# Patient Record
Sex: Female | Born: 1985 | Race: White | Hispanic: No | Marital: Married | Smoking: Never smoker
Health system: Southern US, Community
[De-identification: ages and names within clinical notes are randomized; demographics above are authoritative.]

## PROBLEM LIST (undated history)

## (undated) DIAGNOSIS — Z789 Other specified health status: Secondary | ICD-10-CM

## (undated) HISTORY — DX: Other specified health status: Z78.9

## (undated) HISTORY — PX: TONSILLECTOMY: SUR1361

---
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
## 2017-08-05 ENCOUNTER — Other Ambulatory Visit (INDEPENDENT_AMBULATORY_CARE_PROVIDER_SITE_OTHER): Payer: BLUE CROSS/BLUE SHIELD

## 2017-08-05 VITALS — BP 117/76 | HR 66 | Resp 16 | Ht 68.0 in | Wt 131.6 lb

## 2017-08-05 DIAGNOSIS — Z3201 Encounter for pregnancy test, result positive: Secondary | ICD-10-CM

## 2017-08-05 DIAGNOSIS — Z3200 Encounter for pregnancy test, result unknown: Secondary | ICD-10-CM

## 2017-08-05 LAB — POCT URINE PREGNANCY: PREG TEST UR: POSITIVE — AB

## 2017-08-05 NOTE — Progress Notes (Addendum)
Ms. Tonna Corneriechowski presents today for UPT. She has no unusual complaints. LMP: 06/23/2017  EDD: 03/30/2018    OBJECTIVE: Appears well, in no apparent distress.  OB History   None    Home UPT Result: 07/25/17 Positive In-Office UPT result: Positive I have reviewed the patient's medical, obstetrical, social, and family histories, and medications.   ASSESSMENT: Positive pregnancy test  PLAN [facility] [place] Patient to schedule NEW OB visit/appointment for September Continue taking Prenatal vitamins

## 2017-08-07 NOTE — Progress Notes (Signed)
I have reviewed the chart and agree with nursing staff's documentation of this patient's encounter.  Janille Draughon, MD 08/07/2017 1:23 PM    

## 2017-08-26 ENCOUNTER — Other Ambulatory Visit: Payer: Self-pay

## 2017-08-26 NOTE — Telephone Encounter (Signed)
I have left a message for patient regarding prenatal gummies. She can purchase any PNV otc.

## 2017-08-26 NOTE — Telephone Encounter (Signed)
-----   Message from Lindell SparHeather L Bacon, [place] sent at 08/26/2017  4:04 PM EDT ----- Regarding: rx for gummy prenatal vit Please send rx for gummy vits for prenatal to [facility] on [address]

## 2017-09-04 ENCOUNTER — Encounter: Payer: Self-pay | Admitting: Student

## 2017-09-04 ENCOUNTER — Ambulatory Visit (INDEPENDENT_AMBULATORY_CARE_PROVIDER_SITE_OTHER): Payer: BLUE CROSS/BLUE SHIELD | Admitting: Student

## 2017-09-04 VITALS — BP 129/79 | HR 79 | Wt 127.0 lb

## 2017-09-04 DIAGNOSIS — Z3401 Encounter for supervision of normal first pregnancy, first trimester: Secondary | ICD-10-CM

## 2017-09-04 DIAGNOSIS — Z3400 Encounter for supervision of normal first pregnancy, unspecified trimester: Secondary | ICD-10-CM | POA: Insufficient documentation

## 2017-09-04 NOTE — Progress Notes (Signed)
Pap 2017

## 2017-09-04 NOTE — Progress Notes (Signed)
  Subjective:    Jody Cunningham is being seen today for her first obstetrical visit.  This is a planned pregnancy. She is at [redacted]w[redacted]d gestation. Her obstetrical history is significant for nothing. . Relationship with FOB: spouse, living together. Patient does intend to breast feed. Pregnancy history fully reviewed.  Patient reports no complaints and occasional vomiting (4 times total). . Reports normal pap in the past.  Review of Systems:   Review of Systems  Constitutional: Negative.   HENT: Negative.   Respiratory: Negative.   Cardiovascular: Negative.   Gastrointestinal: Negative.   Genitourinary: Negative.   Musculoskeletal: Negative.   Neurological: Negative.   Hematological: Negative.   Psychiatric/Behavioral: Negative.     Objective:     BP 129/79   Pulse 79   Wt 127 lb (57.6 kg)   LMP 06/23/2017 (Within Days)   BMI 19.31 kg/m  Physical Exam  Constitutional: She appears well-developed.  HENT:  Head: Normocephalic.  Neck: Normal range of motion.  GI: Soft.  Genitourinary: Vagina normal.  Musculoskeletal: Normal range of motion.  Neurological: She is alert.  Skin: Skin is warm and dry.    Exam Breast exam normal, benign, no lumps or masses.  Pelvic exam normal; S=D.    Assessment:    Pregnancy: G1P0 Patient Active Problem List   Diagnosis Date Noted  . Supervision of normal first pregnancy, antepartum 09/04/2017       Plan:     Initial labs drawn. Prenatal vitamins. Problem list reviewed and updated. AFP3 discussed: will call insurance and then let us know. . Role of ultrasound in pregnancy discussed; fetal survey: ordered. Amniocentesis discussed: not indicated. Follow up in 3 weeks. 90% of 30 min visit spent on counseling and coordination of care.  -Pap records will be requested -Welcomed patient to practice; oriented her to role of students, APPs.  Charlesetta Garibaldi [facility] 09/04/2017

## 2017-09-04 NOTE — Patient Instructions (Signed)
-Call insurance and ask abotu Cystic Fibrosis carrier test, Spinal Muscular Atrophy test, and Non-invasive prenatal test.     Eating Plan for Pregnant Women While you are pregnant, your body will require additional nutrition to help support your growing baby. It is recommended that you consume:  150 additional calories each day during your first trimester.  300 additional calories each day during your second trimester.  300 additional calories each day during your third trimester.  Eating a healthy, well-balanced diet is very important for your health and for your baby's health. You also have a higher need for some vitamins and minerals, such as folic acid, calcium, iron, and vitamin D. What do I need to know about eating during pregnancy?  Do not try to lose weight or go on a diet during pregnancy.  Choose healthy, nutritious foods. Choose  of a sandwich with a glass of milk instead of a candy bar or a high-calorie sugar-sweetened beverage.  Limit your overall intake of foods that have "empty calories." These are foods that have little nutritional value, such as sweets, desserts, candies, sugar-sweetened beverages, and fried foods.  Eat a variety of foods, especially fruits and vegetables.  Take a prenatal vitamin to help meet the additional needs during pregnancy, specifically for folic acid, iron, calcium, and vitamin D.  Remember to stay active. Ask your health care provider for exercise recommendations that are specific to you.  Practice good food safety and cleanliness, such as washing your hands before you eat and after you prepare raw meat. This helps to prevent foodborne illnesses, such as listeriosis, that can be very dangerous for your baby. Ask your health care provider for more information about listeriosis. What does 150 extra calories look like? Healthy options for an additional 150 calories each day could be any of the following:  Plain low-fat yogurt (6-8 oz) with   cup of berries.  1 apple with 2 teaspoons of peanut butter.  Cut-up vegetables with  cup of hummus.  Low-fat chocolate milk (8 oz or 1 cup).  1 string cheese with 1 medium orange.   of a peanut butter and jelly sandwich on whole-wheat bread (1 tsp of peanut butter).  For 300 calories, you could eat two of those healthy options each day. What is a healthy amount of weight to gain? The recommended amount of weight for you to gain is based on your pre-pregnancy BMI. If your pre-pregnancy BMI was:  Less than 18 (underweight), you should gain 28-40 lb.  18-24.9 (normal), you should gain 25-35 lb.  25-29.9 (overweight), you should gain 15-25 lb.  Greater than 30 (obese), you should gain 11-20 lb.  What if I am having twins or multiples? Generally, pregnant women who will be having twins or multiples may need to increase their daily calories by 300-600 calories each day. The recommended range for total weight gain is 25-54 lb, depending on your pre-pregnancy BMI. Talk with your health care provider for specific guidance about additional nutritional needs, weight gain, and exercise during your pregnancy. What foods can I eat? Grains Any grains. Try to choose whole grains, such as whole-wheat bread, oatmeal, or brown rice. Vegetables Any vegetables. Try to eat a variety of colors and types of vegetables to get a full range of vitamins and minerals. Remember to wash your vegetables well before eating. Fruits Any fruits. Try to eat a variety of colors and types of fruit to get a full range of vitamins and minerals. Remember to wash your  fruits well before eating. Meats and Other Protein Sources Lean meats, including chicken, [place], fish, and lean cuts of beef, veal, or pork. Make sure that all meats are cooked to "well done." Tofu. Tempeh. Beans. Eggs. Peanut butter and other nut butters. Seafood, such as shrimp, crab, and lobster. If you choose fish, select types that are higher in omega-3  fatty acids, including salmon, herring, mussels, trout, sardines, and pollock. Make sure that all meats are cooked to food-safe temperatures. Dairy Pasteurized milk and milk alternatives. Pasteurized yogurt and pasteurized cheese. Cottage cheese. Sour cream. Beverages Water. Juices that contain 100% fruit juice or vegetable juice. Caffeine-free teas and decaffeinated coffee. Drinks that contain caffeine are okay to drink, but it is better to avoid caffeine. Keep your total caffeine intake to less than 200 mg each day (12 oz of coffee, tea, or soda) or as directed by your health care provider. Condiments Any pasteurized condiments. Sweets and Desserts Any sweets and desserts. Fats and Oils Any fats and oils. The items listed above may not be a complete list of recommended foods or beverages. Contact your dietitian for more options. What foods are not recommended? Vegetables Unpasteurized (raw) vegetable juices. Fruits Unpasteurized (raw) fruit juices. Meats and Other Protein Sources Cured meats that have nitrates, such as bacon, salami, and hotdogs. Luncheon meats, bologna, or other deli meats (unless they are reheated until they are steaming hot). Refrigerated pate, meat spreads from a meat counter, smoked seafood that is found in the refrigerated section of a store. Raw fish, such as sushi or sashimi. High mercury content fish, such as tilefish, shark, swordfish, and king mackerel. Raw meats, such as tuna or beef tartare. Undercooked meats and poultry. Make sure that all meats are cooked to food-safe temperatures. Dairy Unpasteurized (raw) milk and any foods that have raw milk in them. Soft cheeses, such as feta, queso blanco, queso fresco, Brie, Camembert cheeses, blue-veined cheeses, and Panela cheese (unless it is made with pasteurized milk, which must be stated on the label). Beverages Alcohol. Sugar-sweetened beverages, such as sodas, teas, or energy drinks. Condiments Homemade  fermented foods and drinks, such as pickles, sauerkraut, or kombucha drinks. (Store-bought pasteurized versions of these are okay.) Other Salads that are made in the store, such as ham salad, chicken salad, egg salad, tuna salad, and seafood salad. The items listed above may not be a complete list of foods and beverages to avoid. Contact your dietitian for more information. This information is not intended to replace advice given to you by your health care provider. Make sure you discuss any questions you have with your health care provider. Document Released: 10/02/2013 Document Revised: 05/26/2015 Document Reviewed: 06/02/2013 Elsevier Interactive Patient Education  [employer].

## 2017-09-05 ENCOUNTER — Encounter: Payer: Self-pay | Admitting: Student

## 2017-09-05 DIAGNOSIS — Z6791 Unspecified blood type, Rh negative: Secondary | ICD-10-CM | POA: Insufficient documentation

## 2017-09-05 DIAGNOSIS — O26899 Other specified pregnancy related conditions, unspecified trimester: Secondary | ICD-10-CM | POA: Insufficient documentation

## 2017-09-05 LAB — OBSTETRIC PANEL, INCLUDING HIV
Antibody Screen: NEGATIVE
BASOS: 0 %
Basophils Absolute: 0 10*3/uL (ref 0.0–0.2)
EOS (ABSOLUTE): 0 10*3/uL (ref 0.0–0.4)
EOS: 0 %
HEMATOCRIT: 39.3 % (ref 34.0–46.6)
HEMOGLOBIN: 13.3 g/dL (ref 11.1–15.9)
HEP B S AG: NEGATIVE
HIV SCREEN 4TH GENERATION: NONREACTIVE
Immature Grans (Abs): 0 10*3/uL (ref 0.0–0.1)
Immature Granulocytes: 0 %
LYMPHS ABS: 2.3 10*3/uL (ref 0.7–3.1)
Lymphs: 20 %
MCH: 31.7 pg (ref 26.6–33.0)
MCHC: 33.8 g/dL (ref 31.5–35.7)
MCV: 94 fL (ref 79–97)
Monocytes Absolute: 0.6 10*3/uL (ref 0.1–0.9)
Monocytes: 5 %
NEUTROS ABS: 8.6 10*3/uL — AB (ref 1.4–7.0)
Neutrophils: 75 %
Platelets: 305 10*3/uL (ref 150–450)
RBC: 4.2 x10E6/uL (ref 3.77–5.28)
RDW: 12.4 % (ref 12.3–15.4)
RH TYPE: NEGATIVE
RPR: NONREACTIVE
RUBELLA: 3.42 {index} (ref >0.99)
WBC: 11.6 10*3/uL — AB (ref 3.4–10.8)

## 2017-09-06 LAB — URINE CULTURE, OB REFLEX

## 2017-09-06 LAB — CULTURE, OB URINE

## 2017-09-12 LAB — SMN1 COPY NUMBER ANALYSIS (SMA CARRIER SCREENING)

## 2017-09-12 LAB — HEMOGLOBINOPATHY EVALUATION
HGB A: 97.1 % (ref 96.4–98.8)
HGB C: 0 %
HGB S: 0 %
HGB VARIANT: 0 %
Hemoglobin A2 Quantitation: 2.3 % (ref 1.8–3.2)
Hemoglobin F Quantitation: 0.6 % (ref 0.0–2.0)

## 2017-09-12 LAB — CYSTIC FIBROSIS MUTATION 97: GENE DIS ANAL CARRIER INTERP BLD/T-IMP: NOT DETECTED

## 2017-10-01 ENCOUNTER — Ambulatory Visit (INDEPENDENT_AMBULATORY_CARE_PROVIDER_SITE_OTHER): Payer: BLUE CROSS/BLUE SHIELD | Admitting: Obstetrics & Gynecology

## 2017-10-01 ENCOUNTER — Encounter: Payer: Self-pay | Admitting: Obstetrics & Gynecology

## 2017-10-01 VITALS — BP 124/85 | HR 65 | Wt 128.0 lb

## 2017-10-01 DIAGNOSIS — O219 Vomiting of pregnancy, unspecified: Secondary | ICD-10-CM

## 2017-10-01 DIAGNOSIS — Z23 Encounter for immunization: Secondary | ICD-10-CM | POA: Diagnosis not present

## 2017-10-01 DIAGNOSIS — Z3400 Encounter for supervision of normal first pregnancy, unspecified trimester: Secondary | ICD-10-CM

## 2017-10-01 MED ORDER — PROMETHAZINE HCL 25 MG PO TABS: 25.0000 mg | ORAL_TABLET | Freq: Four times a day (QID) | ORAL | 2 refills | Status: DC | PRN

## 2017-10-01 NOTE — Patient Instructions (Signed)
Second Trimester of Pregnancy The second trimester is from week 13 through week 28, month 4 through 6. This is often the time in pregnancy that you feel your best. Often times, morning sickness has lessened or quit. You may have more energy, and you may get hungry more often. Your unborn baby (fetus) is growing rapidly. At the end of the sixth month, he or she is about 9 inches long and weighs about 1 pounds. You will likely feel the baby move (quickening) between 18 and 20 weeks of pregnancy.  Research childbirth classes and hospital preregistration at [email or web address]  Follow these instructions at home:  Avoid all smoking, herbs, and alcohol. Avoid drugs not approved by your doctor.  Do not use any tobacco products, including cigarettes, chewing tobacco, and electronic cigarettes. If you need help quitting, ask your doctor. You may get counseling or other support to help you quit.  Only take medicine as told by your doctor. Some medicines are safe and some are not during pregnancy.  Exercise only as told by your doctor. Stop exercising if you start having cramps.  Eat regular, healthy meals.  Wear a good support bra if your breasts are tender.  Do not use hot tubs, steam rooms, or saunas.  Wear your seat belt when driving.  Avoid raw meat, uncooked cheese, and liter boxes and soil used by cats.  Take your prenatal vitamins.  Take 1500-2000 milligrams of calcium daily starting at the 20th week of pregnancy until you deliver your baby.  Try taking medicine that helps you poop (stool softener) as needed, and if your doctor approves. Eat more fiber by eating fresh fruit, vegetables, and whole grains. Drink enough fluids to keep your pee (urine) clear or pale yellow.  Take warm water baths (sitz baths) to soothe pain or discomfort caused by hemorrhoids. Use hemorrhoid cream if your doctor approves.  If you have puffy, bulging veins (varicose veins), wear support hose. Raise  (elevate) your feet for 15 minutes, 3-4 times a day. Limit salt in your diet.  Avoid heavy lifting, wear low heals, and sit up straight.  Rest with your legs raised if you have leg cramps or low back pain.  Visit your dentist if you have not gone during your pregnancy. Use a soft toothbrush to brush your teeth. Be gentle when you floss.  You can have sex (intercourse) unless your doctor tells you not to.  Go to your doctor visits.  Get help if:  You feel dizzy.  You have mild cramps or pressure in your lower belly (abdomen).  You have a nagging pain in your belly area.  You continue to feel sick to your stomach (nauseous), throw up (vomit), or have watery poop (diarrhea).  You have bad smelling fluid coming from your vagina.  You have pain with peeing (urination). Get help right away if:  You have a fever.  You are leaking fluid from your vagina.  You have spotting or bleeding from your vagina.  You have severe belly cramping or pain.  You lose or gain weight rapidly.  You have trouble catching your breath and have chest pain.  You notice sudden or extreme puffiness (swelling) of your face, hands, ankles, feet, or legs.  You have not felt the baby move in over an hour.  You have severe headaches that do not go away with medicine.  You have vision changes. This information is not intended to replace advice given to you by your health care provider. Make   sure you discuss any questions you have with your health care provider. Document Released: 03/14/2009 Document Revised: 05/26/2015 Document Reviewed: 02/19/2012 Elsevier Interactive Patient Education  2017 Elsevier Inc.    

## 2017-10-01 NOTE — Progress Notes (Signed)
   PRENATAL VISIT NOTE  Subjective:  Jody Cunningham is a 32 y.o. G1P0 at [redacted]w[redacted]d being seen today for ongoing prenatal care.  She is currently monitored for the following issues for this low-risk pregnancy and has Supervision of normal first pregnancy, antepartum and Rh negative state in antepartum period on their problem list.  Patient reports nausea and vomiting, desires medication.   Vag. Bleeding: None.  Movement: Absent. Denies leaking of fluid.   The following portions of the patient's history were reviewed and updated as appropriate: allergies, current medications, past family history, past medical history, past social history, past surgical history and problem list. Problem list updated.  Objective:   Vitals:   10/01/17 1444  BP: 124/85  Pulse: 65  Weight: 128 lb (58.1 kg)    Fetal Status: Fetal Heart Rate (bpm): 164   Movement: Absent     General:  Alert, oriented and cooperative. Patient is in no acute distress.  Skin: Skin is warm and dry. No rash noted.   Cardiovascular: Normal heart rate noted  Respiratory: Normal respiratory effort, no problems with respiration noted  Abdomen: Soft, gravid, appropriate for gestational age.  Pain/Pressure: Absent     Pelvic: Cervical exam deferred        Extremities: Normal range of motion.     Mental Status: Normal mood and affect. Normal behavior. Normal judgment and thought content.   Assessment and Plan:  Pregnancy: G1P0 at [redacted]w[redacted]d  1. Nausea and vomiting in pregnancy Phenergan given, will monitor response. - promethazine (PHENERGAN) 25 MG tablet; Take 1 tablet (25 mg total) by mouth every 6 (six) hours as needed for nausea or vomiting.  Dispense: 30 tablet; Refill: 2 - TSH  2. Supervision of normal first pregnancy, antepartum Panorama done today. - Genetic Screening No other complaints or concerns.  Routine obstetric precautions reviewed. Please refer to After Visit Summary for other counseling recommendations.  Return in  about 4 weeks (around 10/29/2017) for OB Visit.  Future Appointments  Date Time Provider Department Center  11/06/2017  3:00 PM [facility] [place] 3 [facility]    [name], MD

## 2017-10-02 ENCOUNTER — Encounter: Payer: BLUE CROSS/BLUE SHIELD | Admitting: Advanced Practice Midwife

## 2017-10-02 LAB — TSH: TSH: 0.96 u[IU]/mL (ref 0.450–4.500)

## 2017-10-10 ENCOUNTER — Encounter: Payer: Self-pay | Admitting: Radiology

## 2017-10-30 ENCOUNTER — Encounter (HOSPITAL_COMMUNITY): Payer: Self-pay

## 2017-10-30 ENCOUNTER — Ambulatory Visit (INDEPENDENT_AMBULATORY_CARE_PROVIDER_SITE_OTHER): Payer: BLUE CROSS/BLUE SHIELD | Admitting: Advanced Practice Midwife

## 2017-10-30 VITALS — BP 109/72 | HR 69 | Wt 133.0 lb

## 2017-10-30 DIAGNOSIS — Z3402 Encounter for supervision of normal first pregnancy, second trimester: Secondary | ICD-10-CM

## 2017-10-30 DIAGNOSIS — O219 Vomiting of pregnancy, unspecified: Secondary | ICD-10-CM

## 2017-10-30 DIAGNOSIS — O26899 Other specified pregnancy related conditions, unspecified trimester: Secondary | ICD-10-CM

## 2017-10-30 DIAGNOSIS — Z6791 Unspecified blood type, Rh negative: Secondary | ICD-10-CM

## 2017-10-30 NOTE — Patient Instructions (Signed)
Second Trimester of Pregnancy The second trimester is from week 13 through week 28, month 4 through 6. This is often the time in pregnancy that you feel your best. Often times, morning sickness has lessened or quit. You may have more energy, and you may get hungry more often. Your unborn baby (fetus) is growing rapidly. At the end of the sixth month, he or she is about 9 inches long and weighs about 1 pounds. You will likely feel the baby move (quickening) between 18 and 20 weeks of pregnancy.  Research childbirth classes and hospital preregistration at [email or web address]  Follow these instructions at home:  Avoid all smoking, herbs, and alcohol. Avoid drugs not approved by your doctor.  Do not use any tobacco products, including cigarettes, chewing tobacco, and electronic cigarettes. If you need help quitting, ask your doctor. You may get counseling or other support to help you quit.  Only take medicine as told by your doctor. Some medicines are safe and some are not during pregnancy.  Exercise only as told by your doctor. Stop exercising if you start having cramps.  Eat regular, healthy meals.  Wear a good support bra if your breasts are tender.  Do not use hot tubs, steam rooms, or saunas.  Wear your seat belt when driving.  Avoid raw meat, uncooked cheese, and liter boxes and soil used by cats.  Take your prenatal vitamins.  Take 1500-2000 milligrams of calcium daily starting at the 20th week of pregnancy until you deliver your baby.  Try taking medicine that helps you poop (stool softener) as needed, and if your doctor approves. Eat more fiber by eating fresh fruit, vegetables, and whole grains. Drink enough fluids to keep your pee (urine) clear or pale yellow.  Take warm water baths (sitz baths) to soothe pain or discomfort caused by hemorrhoids. Use hemorrhoid cream if your doctor approves.  If you have puffy, bulging veins (varicose veins), wear support hose. Raise  (elevate) your feet for 15 minutes, 3-4 times a day. Limit salt in your diet.  Avoid heavy lifting, wear low heals, and sit up straight.  Rest with your legs raised if you have leg cramps or low back pain.  Visit your dentist if you have not gone during your pregnancy. Use a soft toothbrush to brush your teeth. Be gentle when you floss.  You can have sex (intercourse) unless your doctor tells you not to.  Go to your doctor visits.  Get help if:  You feel dizzy.  You have mild cramps or pressure in your lower belly (abdomen).  You have a nagging pain in your belly area.  You continue to feel sick to your stomach (nauseous), throw up (vomit), or have watery poop (diarrhea).  You have bad smelling fluid coming from your vagina.  You have pain with peeing (urination). Get help right away if:  You have a fever.  You are leaking fluid from your vagina.  You have spotting or bleeding from your vagina.  You have severe belly cramping or pain.  You lose or gain weight rapidly.  You have trouble catching your breath and have chest pain.  You notice sudden or extreme puffiness (swelling) of your face, hands, ankles, feet, or legs.  You have not felt the baby move in over an hour.  You have severe headaches that do not go away with medicine.  You have vision changes. This information is not intended to replace advice given to you by your health care provider. Make   sure you discuss any questions you have with your health care provider. Document Released: 03/14/2009 Document Revised: 05/26/2015 Document Reviewed: 02/19/2012 Elsevier Interactive Patient Education  2017 Elsevier Inc.    

## 2017-10-30 NOTE — Progress Notes (Signed)
   PRENATAL VISIT NOTE  Subjective:  Jody Cunningham is a 32 y.o. G1P0 at [redacted]w[redacted]d being seen today for ongoing prenatal care.  She is currently monitored for the following issues for this low-risk pregnancy and has Supervision of normal first pregnancy, antepartum and Rh negative state in antepartum period on their problem list.  Patient reports no complaints.  Contractions: Not present. Vag. Bleeding: None.  Movement: Absent. Denies leaking of fluid.   The following portions of the patient's history were reviewed and updated as appropriate: allergies, current medications, past family history, past medical history, past social history, past surgical history and problem list. Problem list updated.  Objective:   Vitals:   10/30/17 1451  BP: 109/72  Pulse: 69  Weight: 133 lb (60.3 kg)    Fetal Status: Fetal Heart Rate (bpm): 157   Movement: Absent     General:  Alert, oriented and cooperative. Patient is in no acute distress.  Skin: Skin is warm and dry. No rash noted.   Cardiovascular: Normal heart rate noted  Respiratory: Normal respiratory effort, no problems with respiration noted  Abdomen: Soft, gravid, appropriate for gestational age.  Pain/Pressure: Present     Pelvic: Cervical exam deferred        Extremities: Normal range of motion.     Mental Status: Normal mood and affect. Normal behavior. Normal judgment and thought content.   Assessment and Plan:  Pregnancy: G1P0 at [redacted]w[redacted]d  1. Encounter for supervision of normal first pregnancy in second trimester - No complaints or concerns, continue routine care - Reviewed Panorama results, AFP declined - Confirmed location for anatomy scan 11/06  2. Nausea and vomiting in pregnancy - Improving with intermittent use of Phenergan - 5 lb weight gain from previous visit  3. Rh negative state in antepartum period - Rhogam at 28 weeks  Preterm labor symptoms and general obstetric precautions including but not limited to vaginal  bleeding, contractions, leaking of fluid and fetal movement were reviewed in detail with the patient. Please refer to After Visit Summary for other counseling recommendations.  Return in about 4 weeks (around 11/27/2017).  Future Appointments  Date Time Provider Department Center  11/06/2017  3:00 PM [facility] [place] 3 [facility]    Calvert Cantor, CNM

## 2017-11-06 ENCOUNTER — Ambulatory Visit (HOSPITAL_COMMUNITY)
Admission: RE | Admit: 2017-11-06 | Discharge: 2017-11-06 | Disposition: A | Discharge status: Home / Self Care | Payer: BLUE CROSS/BLUE SHIELD | Source: Ambulatory Visit | Attending: Student | Admitting: Student

## 2017-11-06 DIAGNOSIS — Z3A19 19 weeks gestation of pregnancy: Secondary | ICD-10-CM | POA: Insufficient documentation

## 2017-11-06 DIAGNOSIS — Z363 Encounter for antenatal screening for malformations: Secondary | ICD-10-CM | POA: Insufficient documentation

## 2017-11-06 DIAGNOSIS — Z3401 Encounter for supervision of normal first pregnancy, first trimester: Secondary | ICD-10-CM

## 2017-11-07 ENCOUNTER — Other Ambulatory Visit (HOSPITAL_COMMUNITY): Payer: Self-pay | Admitting: *Deleted

## 2017-11-07 DIAGNOSIS — O35EXX0 Maternal care for other (suspected) fetal abnormality and damage, fetal genitourinary anomalies, not applicable or unspecified: Secondary | ICD-10-CM

## 2017-11-07 DIAGNOSIS — O358XX0 Maternal care for other (suspected) fetal abnormality and damage, not applicable or unspecified: Secondary | ICD-10-CM

## 2017-11-08 ENCOUNTER — Encounter: Payer: Self-pay | Admitting: Student

## 2017-11-08 DIAGNOSIS — O359XX0 Maternal care for (suspected) fetal abnormality and damage, unspecified, not applicable or unspecified: Secondary | ICD-10-CM | POA: Insufficient documentation

## 2017-11-27 ENCOUNTER — Ambulatory Visit (INDEPENDENT_AMBULATORY_CARE_PROVIDER_SITE_OTHER): Payer: BLUE CROSS/BLUE SHIELD | Admitting: Advanced Practice Midwife

## 2017-11-27 VITALS — BP 108/72 | HR 98 | Wt 137.0 lb

## 2017-11-27 DIAGNOSIS — Z3402 Encounter for supervision of normal first pregnancy, second trimester: Secondary | ICD-10-CM

## 2017-11-27 DIAGNOSIS — O26899 Other specified pregnancy related conditions, unspecified trimester: Secondary | ICD-10-CM

## 2017-11-27 DIAGNOSIS — O26892 Other specified pregnancy related conditions, second trimester: Secondary | ICD-10-CM

## 2017-11-27 DIAGNOSIS — R109 Unspecified abdominal pain: Secondary | ICD-10-CM

## 2017-11-27 DIAGNOSIS — Z3A22 22 weeks gestation of pregnancy: Secondary | ICD-10-CM

## 2017-11-27 DIAGNOSIS — O219 Vomiting of pregnancy, unspecified: Secondary | ICD-10-CM

## 2017-11-27 DIAGNOSIS — Z6791 Unspecified blood type, Rh negative: Secondary | ICD-10-CM

## 2017-11-27 NOTE — Progress Notes (Signed)
   PRENATAL VISIT NOTE  Subjective:  Jody Cunningham is a 32 y.o. G1P0 at 3943w3d being seen today for ongoing prenatal care.  She is currently monitored for the following issues for this low-risk pregnancy and has Supervision of normal first pregnancy, antepartum; Rh negative state in antepartum period; and Fetal abnormality affecting management of mother on their problem list.  Patient reports backache. She twice mentioned acute back pain, bilaterally. Unable to rate, does not radiate, occurs and resolves very quickly. Patient states she normally sleeps on her stomach but has changed to sleeping on her side in pregnancy and she thinks this change may be the source of her discomfort. She denies dysuria, pelvic pain, abdominal pain, and flank pain. She does not have a history of kidney stones or UTIs. Her mother and father are visiting for the week and enjoying the family time. Contractions: Not present. Vag. Bleeding: None.  Movement: Present. Denies leaking of fluid.   The following portions of the patient's history were reviewed and updated as appropriate: allergies, current medications, past family history, past medical history, past social history, past surgical history and problem list. Problem list updated.  Objective:   Vitals:   11/27/17 1109  BP: 108/72  Pulse: 98  Weight: 137 lb (62.1 kg)    Fetal Status: Fetal Heart Rate (bpm): 159 Fundal Height: 22 cm Movement: Present     General:  Alert, oriented and cooperative. Patient is in no acute distress.  Skin: Skin is warm and dry. No rash noted.   Cardiovascular: Normal heart rate noted  Respiratory: Normal respiratory effort, no problems with respiration noted  Abdomen: Soft, gravid, appropriate for gestational age.  Pain/Pressure: Absent     Pelvic: Cervical exam deferred        Extremities: Normal range of motion.  Edema: None  Mental Status: Normal mood and affect. Normal behavior. Normal judgment and thought content.    Assessment and Plan:  Pregnancy: G1P0 at 1343w3d  1. Encounter for supervision of normal first pregnancy in second trimester --No complaints or concerns today, continue routine care --Reviewed scheduling intervals for remaining visits --Discussed results of anatomy scan and confirmed patient aware of existing appt for followup scan  2. Flank pain - No concerning findings on physical exam - Low suspicion for UTI or kidney stone, patient consents to workup given risk factors associated with untreated UTI in pregnancy - Culture, OB Urine - CBC w/Diff  3. Nausea and vomiting in pregnancy - Nausea resolved, vomiting significantly improved - Weight up 4lbs from previous visit, up 10 lbs from New OB appt  4. Rh negative state in antepartum period - Rhogam at 28 weeks and as indicated pp  Preterm labor symptoms and general obstetric precautions including but not limited to vaginal bleeding, contractions, leaking of fluid and fetal movement were reviewed in detail with the patient. Please refer to After Visit Summary for other counseling recommendations.  Return in about 4 weeks (around 12/25/2017).  Future Appointments  Date Time Provider Department Center  12/04/2017  3:00 PM [facility] [place] 3 [facility]  01/02/2018  3:00 PM [place] Bing[place]ens, Charlie, MD [facility]    Calvert CantorSamantha C Shavonta Gossen, [place]

## 2017-11-27 NOTE — Patient Instructions (Signed)
Second Trimester of Pregnancy The second trimester is from week 13 through week 28, month 4 through 6. This is often the time in pregnancy that you feel your best. Often times, morning sickness has lessened or quit. You may have more energy, and you may get hungry more often. Your unborn baby (fetus) is growing rapidly. At the end of the sixth month, he or she is about 9 inches long and weighs about 1 pounds. You will likely feel the baby move (quickening) between 18 and 20 weeks of pregnancy. Follow these instructions at home:  Avoid all smoking, herbs, and alcohol. Avoid drugs not approved by your doctor.  Do not use any tobacco products, including cigarettes, chewing tobacco, and electronic cigarettes. If you need help quitting, ask your doctor. You may get counseling or other support to help you quit.  Only take medicine as told by your doctor. Some medicines are safe and some are not during pregnancy.  Exercise only as told by your doctor. Stop exercising if you start having cramps.  Eat regular, healthy meals.  Wear a good support bra if your breasts are tender.  Do not use hot tubs, steam rooms, or saunas.  Wear your seat belt when driving.  Avoid raw meat, uncooked cheese, and liter boxes and soil used by cats.  Take your prenatal vitamins.  Take 1500-2000 milligrams of calcium daily starting at the 20th week of pregnancy until you deliver your baby.  Try taking medicine that helps you poop (stool softener) as needed, and if your doctor approves. Eat more fiber by eating fresh fruit, vegetables, and whole grains. Drink enough fluids to keep your pee (urine) clear or pale yellow.  Take warm water baths (sitz baths) to soothe pain or discomfort caused by hemorrhoids. Use hemorrhoid cream if your doctor approves.  If you have puffy, bulging veins (varicose veins), wear support hose. Raise (elevate) your feet for 15 minutes, 3-4 times a day. Limit salt in your diet.  Avoid heavy  lifting, wear low heals, and sit up straight.  Rest with your legs raised if you have leg cramps or low back pain.  Visit your dentist if you have not gone during your pregnancy. Use a soft toothbrush to brush your teeth. Be gentle when you floss.  You can have sex (intercourse) unless your doctor tells you not to.  Go to your doctor visits. Get help if:  You feel dizzy.  You have mild cramps or pressure in your lower belly (abdomen).  You have a nagging pain in your belly area.  You continue to feel sick to your stomach (nauseous), throw up (vomit), or have watery poop (diarrhea).  You have bad smelling fluid coming from your vagina.  You have pain with peeing (urination). Get help right away if:  You have a fever.  You are leaking fluid from your vagina.  You have spotting or bleeding from your vagina.  You have severe belly cramping or pain.  You lose or gain weight rapidly.  You have trouble catching your breath and have chest pain.  You notice sudden or extreme puffiness (swelling) of your face, hands, ankles, feet, or legs.  You have not felt the baby move in over an hour.  You have severe headaches that do not go away with medicine.  You have vision changes. This information is not intended to replace advice given to you by your health care provider. Make sure you discuss any questions you have with your health care   provider. Document Released: 03/14/2009 Document Revised: 05/26/2015 Document Reviewed: 02/19/2012 Elsevier Interactive Patient Education  2017 [employer]vier Inc.  

## 2017-11-28 LAB — CBC WITH DIFFERENTIAL/PLATELET
BASOS ABS: 0 10*3/uL (ref 0.0–0.2)
Basos: 0 %
EOS (ABSOLUTE): 0.1 10*3/uL (ref 0.0–0.4)
Eos: 1 %
Hematocrit: 35.7 % (ref 34.0–46.6)
Hemoglobin: 12.2 g/dL (ref 11.1–15.9)
IMMATURE GRANS (ABS): 0.1 10*3/uL (ref 0.0–0.1)
Immature Granulocytes: 1 %
LYMPHS: 15 %
Lymphocytes Absolute: 1.9 10*3/uL (ref 0.7–3.1)
MCH: 32.5 pg (ref 26.6–33.0)
MCHC: 34.2 g/dL (ref 31.5–35.7)
MCV: 95 fL (ref 79–97)
MONOS ABS: 0.6 10*3/uL (ref 0.1–0.9)
Monocytes: 5 %
NEUTROS ABS: 10.1 10*3/uL — AB (ref 1.4–7.0)
Neutrophils: 78 %
PLATELETS: 354 10*3/uL (ref 150–450)
RBC: 3.75 x10E6/uL — ABNORMAL LOW (ref 3.77–5.28)
RDW: 12.2 % — AB (ref 12.3–15.4)
WBC: 12.9 10*3/uL — ABNORMAL HIGH (ref 3.4–10.8)

## 2017-11-29 LAB — CULTURE, OB URINE

## 2017-11-29 LAB — URINE CULTURE, OB REFLEX

## 2017-12-04 ENCOUNTER — Encounter (HOSPITAL_COMMUNITY): Payer: Self-pay

## 2017-12-04 ENCOUNTER — Ambulatory Visit (HOSPITAL_COMMUNITY)
Admission: RE | Admit: 2017-12-04 | Discharge: 2017-12-04 | Disposition: A | Discharge status: Home / Self Care | Payer: BLUE CROSS/BLUE SHIELD | Source: Ambulatory Visit | Attending: Student | Admitting: Student

## 2017-12-04 DIAGNOSIS — O283 Abnormal ultrasonic finding on antenatal screening of mother: Secondary | ICD-10-CM | POA: Diagnosis not present

## 2017-12-04 DIAGNOSIS — O35EXX0 Maternal care for other (suspected) fetal abnormality and damage, fetal genitourinary anomalies, not applicable or unspecified: Secondary | ICD-10-CM

## 2017-12-04 DIAGNOSIS — O358XX0 Maternal care for other (suspected) fetal abnormality and damage, not applicable or unspecified: Secondary | ICD-10-CM

## 2017-12-04 DIAGNOSIS — Z362 Encounter for other antenatal screening follow-up: Secondary | ICD-10-CM

## 2017-12-04 DIAGNOSIS — Z3A23 23 weeks gestation of pregnancy: Secondary | ICD-10-CM | POA: Diagnosis not present

## 2018-01-01 NOTE — L&D Delivery Note (Signed)
Jody Cunningham is a 33 y.o. female G1P0 with IUP at [redacted]w[redacted]d admitted for SROM.  She progressed with pitocin augmentation to complete and pushed 5 hours to deliver.  Cord clamping delayed by several minutes then clamped by CNM and cut by FOB.    Delivery Note At 5:39 AM a viable female was delivered via Vaginal, Spontaneous (Presentation: ROA).  APGAR: 8, 8; weight pending.   Placenta status: spontaneous, intact.  Cord: 3 vessels  Anesthesia:  epidural Episiotomy: None Lacerations: 1st degree, hemostatic Suture Repair: none Est. Blood Loss (mL): 79  Mom to postpartum.  Baby to Couplet care / Skin to Skin.  Rolm Bookbinder CNM 03/31/2018, 6:03 AM

## 2018-01-02 ENCOUNTER — Ambulatory Visit (INDEPENDENT_AMBULATORY_CARE_PROVIDER_SITE_OTHER): Payer: BLUE CROSS/BLUE SHIELD | Admitting: Obstetrics and Gynecology

## 2018-01-02 DIAGNOSIS — Z3400 Encounter for supervision of normal first pregnancy, unspecified trimester: Secondary | ICD-10-CM

## 2018-01-02 DIAGNOSIS — Z3402 Encounter for supervision of normal first pregnancy, second trimester: Secondary | ICD-10-CM

## 2018-01-02 NOTE — Progress Notes (Signed)
Prenatal Visit Note Date: 01/02/2018 Clinic: [facility]  Subjective:  Linnell Nazir is a 33 y.o. G1P0 at [redacted]w[redacted]d being seen today for ongoing prenatal care.  She is currently monitored for the following issues for this low-risk pregnancy and has Supervision of normal first pregnancy, antepartum; Rh negative state in antepartum period; and Fetal abnormality affecting management of mother on their problem list.  Patient reports no complaints.   Contractions: Not present. Vag. Bleeding: None.  Movement: Present. Denies leaking of fluid.   The following portions of the patient's history were reviewed and updated as appropriate: allergies, current medications, past family history, past medical history, past social history, past surgical history and problem list. Problem list updated.  Objective:   Vitals:   01/02/18 1452  BP: 108/67  Pulse: 99  Weight: 148 lb (67.1 kg)    Fetal Status: Fetal Heart Rate (bpm): 157 Fundal Height: 27 cm Movement: Present     General:  Alert, oriented and cooperative. Patient is in no acute distress.  Skin: Skin is warm and dry. No rash noted.   Cardiovascular: Normal heart rate noted  Respiratory: Normal respiratory effort, no problems with respiration noted  Abdomen: Soft, gravid, appropriate for gestational age. Pain/Pressure: Absent     Pelvic:  Cervical exam deferred        Extremities: Normal range of motion.  Edema: None  Mental Status: Normal mood and affect. Normal behavior. Normal judgment and thought content.   Urinalysis:      Assessment and Plan:  Pregnancy: G1P0 at [redacted]w[redacted]d  1. Supervision of normal first pregnancy, antepartum Routine care. 28wk labs and rhogam nv - CBC; Future - HIV antibody (with reflex); Future - Glucose Tolerance, 2 Hours w/1 Hour; Future - RPR; Future - Antibody screen; Future  Preterm labor symptoms and general obstetric precautions including but not limited to vaginal bleeding,  contractions, leaking of fluid and fetal movement were reviewed in detail with the patient. Please refer to After Visit Summary for other counseling recommendations.  Return in about 10 days (around 01/12/2018) for rob and 2h gtt.   [place] Bing, MD

## 2018-01-14 ENCOUNTER — Ambulatory Visit (INDEPENDENT_AMBULATORY_CARE_PROVIDER_SITE_OTHER): Payer: BLUE CROSS/BLUE SHIELD | Admitting: Obstetrics and Gynecology

## 2018-01-14 DIAGNOSIS — Z3403 Encounter for supervision of normal first pregnancy, third trimester: Secondary | ICD-10-CM

## 2018-01-14 DIAGNOSIS — Z6791 Unspecified blood type, Rh negative: Secondary | ICD-10-CM

## 2018-01-14 DIAGNOSIS — O360130 Maternal care for anti-D [Rh] antibodies, third trimester, not applicable or unspecified: Secondary | ICD-10-CM | POA: Diagnosis not present

## 2018-01-14 DIAGNOSIS — O26899 Other specified pregnancy related conditions, unspecified trimester: Secondary | ICD-10-CM

## 2018-01-14 DIAGNOSIS — Z23 Encounter for immunization: Secondary | ICD-10-CM | POA: Diagnosis not present

## 2018-01-14 DIAGNOSIS — O26893 Other specified pregnancy related conditions, third trimester: Secondary | ICD-10-CM

## 2018-01-14 DIAGNOSIS — Z3A29 29 weeks gestation of pregnancy: Secondary | ICD-10-CM

## 2018-01-14 DIAGNOSIS — Z3400 Encounter for supervision of normal first pregnancy, unspecified trimester: Secondary | ICD-10-CM

## 2018-01-14 MED ORDER — RHO D IMMUNE GLOBULIN 1500 UNIT/2ML IJ SOSY
300.0000 ug | PREFILLED_SYRINGE | Freq: Once | INTRAMUSCULAR | Status: AC
  Administered 2018-01-14: 300 ug via INTRAMUSCULAR

## 2018-01-14 NOTE — Progress Notes (Signed)
-  tdap today

## 2018-01-14 NOTE — Progress Notes (Signed)
Prenatal Visit Note Date: 01/14/2018 Clinic: [facility]  Subjective:  Jody Cunningham is a 33 y.o. G1P0 at [redacted]w[redacted]d being seen today for ongoing prenatal care.  She is currently monitored for the following issues for this low-risk pregnancy and has Supervision of normal first pregnancy, antepartum; Rh negative state in antepartum period; and Fetal abnormality affecting management of mother on their problem list.  Patient reports leg cramps.   Contractions: Not present.  .  Movement: Present. Denies leaking of fluid.   The following portions of the patient's history were reviewed and updated as appropriate: allergies, current medications, past family history, past medical history, past social history, past surgical history and problem list. Problem list updated.  Objective:   Vitals:   01/14/18 0902  BP: 105/71  Pulse: 71  Weight: 148 lb 1.6 oz (67.2 kg)    Fetal Status: Fetal Heart Rate (bpm): 155 Fundal Height: 29 cm Movement: Present     General:  Alert, oriented and cooperative. Patient is in no acute distress.  Skin: Skin is warm and dry. No rash noted.   Cardiovascular: Normal heart rate noted  Respiratory: Normal respiratory effort, no problems with respiration noted  Abdomen: Soft, gravid, appropriate for gestational age. Pain/Pressure: Absent     Pelvic:  Cervical exam deferred        Extremities: Normal range of motion.  Edema: None  Mental Status: Normal mood and affect. Normal behavior. Normal judgment and thought content.   Urinalysis:      Assessment and Plan:  Pregnancy: G1P0 at [redacted]w[redacted]d  1. Supervision of normal first pregnancy, antepartum Routine care. Rhogam today. Fetal pyelectasis resolved on last u/s but mfm recommend rpt in 4wks. I told her I don't think this is necessary and pt agrees.  - CBC - HIV antibody (with reflex) - Glucose Tolerance, 2 Hours w/1 Hour - RPR - Antibody screen   Preterm labor symptoms and general obstetric  precautions including but not limited to vaginal bleeding, contractions, leaking of fluid and fetal movement were reviewed in detail with the patient. Please refer to After Visit Summary for other counseling recommendations.  Return in about 2 weeks (around 01/28/2018) for rob.   [place] Bing, MD

## 2018-01-14 NOTE — Patient Instructions (Addendum)
If you want to do Magnesium, it is completely safe with pregnancy. If you have constipation, do magnesium oxide 400mg  by mouth daily as needed. If you don't have constipation, do Magnesium malate 1 pill daily. You can get either of these at a [facility] or any drug store    [email or web address]  What causes leg cramps during pregnancy, and can they be prevented? Answer From Murriel Hopper, M.D. Multimedia Leg cramp stretch during pregnancy Leg cramp stretch during pregnancy Leg cramps - painful involuntary muscle contractions that typically affect the calf, foot or both - are common during pregnancy, often striking at night during the second and third trimesters.  While the exact cause of leg cramps during pregnancy isn't clear, you can take steps to prevent them. For example:  Stretch your calf muscles. Although evidence is lacking, stretching before bed might help prevent leg cramps during pregnancy. Stand at arm's length from a wall, place your hands on the wall in front of you and move your right foot behind your left foot. Slowly bend your left leg forward, keeping your right knee straight and your right heel on the floor. Hold the stretch for about 30 seconds, being careful to keep your back straight and your hips forward. Don't rotate your feet inward or outward. Switch legs and repeat. Stay active. Regular physical activity might help prevent leg cramps during pregnancy. Before you begin an exercise program, make sure you have your health care provider's OK. Take a magnesium supplement. Limited research suggests that taking a magnesium supplement might help prevent leg cramps during pregnancy. Make sure you have your health care provider's OK to take a supplement. You might also consider eating more magnesium-rich foods, such as whole grains, beans, dried fruits, nuts and seeds. Stay hydrated.  Keeping your muscles hydrated might help prevent cramps. Your urine should be relatively clear or light yellow in color if you are properly hydrated. If your urine is darker yellow, it might mean that you're not getting enough water. Get adequate calcium. Some research suggests reduced levels of calcium in your blood during pregnancy may contribute to leg cramps. All women, including pregnant women, should get 1,000 milligrams of calcium a day. Choose proper footwear. Choose shoes with comfort, support and utility in mind. It might help to wear shoes with a firm heel counter - the part of the shoe that surrounds the heel and helps lock the foot into the shoe. If a leg cramp strikes, stretch the calf muscle on the affected side. Walking and then elevating your legs might help keep the leg cramp from returning. A hot shower, warm bath, ice massage or muscle massage also might help.  With  Murriel Hopper, M.D.

## 2018-01-15 LAB — GLUCOSE TOLERANCE, 2 HOURS W/ 1HR
GLUCOSE, 1 HOUR: 114 mg/dL (ref 65–179)
Glucose, 2 hour: 96 mg/dL (ref 65–152)
Glucose, Fasting: 84 mg/dL (ref 65–91)

## 2018-01-15 LAB — CBC
HEMATOCRIT: 30.8 % — AB (ref 34.0–46.6)
Hemoglobin: 10.5 g/dL — ABNORMAL LOW (ref 11.1–15.9)
MCH: 32 pg (ref 26.6–33.0)
MCHC: 34.1 g/dL (ref 31.5–35.7)
MCV: 94 fL (ref 79–97)
Platelets: 348 10*3/uL (ref 150–450)
RBC: 3.28 x10E6/uL — ABNORMAL LOW (ref 3.77–5.28)
RDW: 11.9 % (ref 11.7–15.4)
WBC: 13 10*3/uL — ABNORMAL HIGH (ref 3.4–10.8)

## 2018-01-15 LAB — HIV ANTIBODY (ROUTINE TESTING W REFLEX): HIV Screen 4th Generation wRfx: NONREACTIVE

## 2018-01-15 LAB — ANTIBODY SCREEN: Antibody Screen: NEGATIVE

## 2018-01-15 LAB — RPR: RPR Ser Ql: NONREACTIVE

## 2018-02-06 ENCOUNTER — Ambulatory Visit (INDEPENDENT_AMBULATORY_CARE_PROVIDER_SITE_OTHER): Payer: BLUE CROSS/BLUE SHIELD | Admitting: Obstetrics & Gynecology

## 2018-02-06 DIAGNOSIS — Z3400 Encounter for supervision of normal first pregnancy, unspecified trimester: Secondary | ICD-10-CM

## 2018-02-06 DIAGNOSIS — Z3403 Encounter for supervision of normal first pregnancy, third trimester: Secondary | ICD-10-CM

## 2018-02-06 NOTE — Progress Notes (Signed)
   PRENATAL VISIT NOTE  Subjective:  Jody Cunningham is a 33 y.o. G1P0 at [redacted]w[redacted]d being seen today for ongoing prenatal care.  She is currently monitored for the following issues for this low-risk pregnancy and has Supervision of normal first pregnancy, antepartum and Rh negative state in antepartum period on their problem list.  Patient reports backache and pelvic pain occasionally, some Braxton-Hicks contractions.  Contractions: Irritability. Vag. Bleeding: None.  Movement: Present. Denies leaking of fluid.   The following portions of the patient's history were reviewed and updated as appropriate: allergies, current medications, past family history, past medical history, past social history, past surgical history and problem list. Problem list updated.  Objective:   Vitals:   02/06/18 1419  BP: 114/75  Pulse: 94  Weight: 155 lb (70.3 kg)    Fetal Status: Fetal Heart Rate (bpm): 155 Fundal Height: 32 cm Movement: Present     General:  Alert, oriented and cooperative. Patient is in no acute distress.  Skin: Skin is warm and dry. No rash noted.   Cardiovascular: Normal heart rate noted  Respiratory: Normal respiratory effort, no problems with respiration noted  Abdomen: Soft, gravid, appropriate for gestational age.  Pain/Pressure: Absent     Pelvic: Cervical exam deferred        Extremities: Normal range of motion.  Edema: Trace  Mental Status: Normal mood and affect. Normal behavior. Normal judgment and thought content.   Assessment and Plan:  Pregnancy: G1P0 at [redacted]w[redacted]d  1. Supervision of normal first pregnancy, antepartum Reassured about normal symptoms in third trimester. Preterm labor symptoms and general obstetric precautions including but not limited to vaginal bleeding, contractions, leaking of fluid and fetal movement were reviewed in detail with the patient. Please refer to After Visit Summary for other counseling recommendations.  Return in about 2 weeks (around  02/20/2018) for OB Visit.  Future Appointments  Date Time Provider Department Center  02/06/2018  3:00 PM Siddhanth Denk, Jethro Bastos, MD [facility]    [name], MD

## 2018-02-06 NOTE — Patient Instructions (Signed)
Return to clinic for any scheduled appointments or obstetric concerns, or go to MAU for evaluation  

## 2018-02-10 ENCOUNTER — Encounter: Payer: Self-pay | Admitting: Radiology

## 2018-02-20 ENCOUNTER — Encounter: Payer: Self-pay | Admitting: Obstetrics and Gynecology

## 2018-02-20 ENCOUNTER — Ambulatory Visit (INDEPENDENT_AMBULATORY_CARE_PROVIDER_SITE_OTHER): Payer: BLUE CROSS/BLUE SHIELD | Admitting: Obstetrics and Gynecology

## 2018-02-20 VITALS — BP 122/75 | HR 94 | Wt 161.0 lb

## 2018-02-20 DIAGNOSIS — Z3403 Encounter for supervision of normal first pregnancy, third trimester: Secondary | ICD-10-CM

## 2018-02-20 DIAGNOSIS — Z3400 Encounter for supervision of normal first pregnancy, unspecified trimester: Secondary | ICD-10-CM

## 2018-02-20 DIAGNOSIS — Z3A34 34 weeks gestation of pregnancy: Secondary | ICD-10-CM

## 2018-02-20 NOTE — Progress Notes (Signed)
Prenatal Visit Note Date: 02/20/2018 Clinic: [facility]  Subjective:  Jody Cunningham is a 33 y.o. G1P0 at [redacted]w[redacted]d being seen today for ongoing prenatal care.  She is currently monitored for the following issues for this low-risk pregnancy and has Supervision of normal first pregnancy, antepartum and Rh negative state in antepartum period on their problem list.  Patient reports occasional DOE Contractions: Not present. Vag. Bleeding: None.  Movement: Present. Denies leaking of fluid.   The following portions of the patient's history were reviewed and updated as appropriate: allergies, current medications, past family history, past medical history, past social history, past surgical history and problem list. Problem list updated.  Objective:   Vitals:   02/20/18 1451  BP: 122/75  Pulse: 94  Weight: 161 lb (73 kg)    Fetal Status: Fetal Heart Rate (bpm): 143 Fundal Height: 34 cm Movement: Present     General:  Alert, oriented and cooperative. Patient is in no acute distress.  Skin: Skin is warm and dry. No rash noted.   Cardiovascular: Normal heart rate noted normal s1, s2, normal SEM, no rubs or gallops  Respiratory: Normal respiratory effort, no problems with respiration noted CTAB  Abdomen: Soft, gravid, appropriate for gestational age. Pain/Pressure: Present     Pelvic:  Cervical exam deferred        Extremities: Normal range of motion.  Edema: Trace  Mental Status: Normal mood and affect. Normal behavior. Normal judgment and thought content.   Urinalysis:      Assessment and Plan:  Pregnancy: G1P0 at [redacted]w[redacted]d  1. Supervision of normal first pregnancy, antepartum Normal dyspnea of pregnancy.   Preterm labor symptoms and general obstetric precautions including but not limited to vaginal bleeding, contractions, leaking of fluid and fetal movement were reviewed in detail with the patient. Please refer to After Visit Summary for other counseling  recommendations.  Return in about 2 weeks (around 03/06/2018) for rob.   [place] Bing, MD

## 2018-03-06 ENCOUNTER — Ambulatory Visit (INDEPENDENT_AMBULATORY_CARE_PROVIDER_SITE_OTHER): Payer: BLUE CROSS/BLUE SHIELD | Admitting: Family Medicine

## 2018-03-06 ENCOUNTER — Telehealth: Payer: Self-pay

## 2018-03-06 VITALS — BP 124/77 | HR 98 | Wt 162.0 lb

## 2018-03-06 DIAGNOSIS — Z3400 Encounter for supervision of normal first pregnancy, unspecified trimester: Secondary | ICD-10-CM

## 2018-03-06 DIAGNOSIS — Z3A36 36 weeks gestation of pregnancy: Secondary | ICD-10-CM

## 2018-03-06 DIAGNOSIS — Z3403 Encounter for supervision of normal first pregnancy, third trimester: Secondary | ICD-10-CM

## 2018-03-06 DIAGNOSIS — Z113 Encounter for screening for infections with a predominantly sexual mode of transmission: Secondary | ICD-10-CM | POA: Diagnosis not present

## 2018-03-06 NOTE — Progress Notes (Signed)
    PRENATAL VISIT NOTE  Subjective:  Jody Cunningham is a 33 y.o. G1P0 at [redacted]w[redacted]d being seen today for ongoing prenatal care.  She is currently monitored for the following issues for this low-risk pregnancy and has Supervision of normal first pregnancy, antepartum and Rh negative state in antepartum period on their problem list.  Patient reports no complaints.  Contractions: Not present. Vag. Bleeding: None.  Movement: Present. Denies leaking of fluid.   The following portions of the patient's history were reviewed and updated as appropriate: allergies, current medications, past family history, past medical history, past social history, past surgical history and problem list. Problem list updated.  Objective:   Vitals:   03/06/18 1603  BP: 124/77  Pulse: 98  Weight: 162 lb (73.5 kg)    Fetal Status: Fetal Heart Rate (bpm): 138 Fundal Height: 35 cm Movement: Present  Presentation: Vertex  General:  Alert, oriented and cooperative. Patient is in no acute distress.  Skin: Skin is warm and dry. No rash noted.   Cardiovascular: Normal heart rate noted  Respiratory: Normal respiratory effort, no problems with respiration noted  Abdomen: Soft, gravid, appropriate for gestational age.  Pain/Pressure: Present     Pelvic: Cervical exam performed Dilation: Fingertip Effacement (%): 80 Station: -1  Extremities: Normal range of motion.  Edema: Trace  Mental Status: Normal mood and affect. Normal behavior. Normal judgment and thought content.   Assessment and Plan:  Pregnancy: G1P0 at [redacted]w[redacted]d  1. Supervision of normal first pregnancy, antepartum Cultures today Continue routine prenatal care. - Strep Gp B NAA - GC/Chlamydia probe amp ([place])not at [facility]  Preterm labor symptoms and general obstetric precautions including but not limited to vaginal bleeding, contractions, leaking of fluid and fetal movement were reviewed in detail with the patient. Please refer to After Visit Summary for  other counseling recommendations.  Return in 1 week (on 03/13/2018).  Future Appointments  Date Time Provider Department Center  03/13/2018  8:30 AM Reva Bores, MD [facility]    Reva Bores, MD

## 2018-03-06 NOTE — Patient Instructions (Signed)

## 2018-03-06 NOTE — Progress Notes (Signed)
Had some vomiting this last week, taking tums now which seems to help

## 2018-03-06 NOTE — Telephone Encounter (Signed)
Patient called stating she has been having some vomiting the past couple of days.She is holding food down but its certain things she spits/vomited after she eats or drinks. I have advised her to start taking tums everyday to see if she can get some relief form the vomiting/spitting up. Patient advised understanding she has a follow up appointment on 03/06/2018.

## 2018-03-08 LAB — STREP GP B NAA: Strep Gp B NAA: NEGATIVE

## 2018-03-11 LAB — GC/CHLAMYDIA PROBE AMP (~~LOC~~) NOT AT ARMC
Chlamydia: NEGATIVE
Neisseria Gonorrhea: NEGATIVE

## 2018-03-13 ENCOUNTER — Ambulatory Visit (INDEPENDENT_AMBULATORY_CARE_PROVIDER_SITE_OTHER): Payer: BLUE CROSS/BLUE SHIELD | Admitting: Family Medicine

## 2018-03-13 ENCOUNTER — Other Ambulatory Visit: Payer: Self-pay

## 2018-03-13 ENCOUNTER — Telehealth: Payer: Self-pay | Admitting: Radiology

## 2018-03-13 VITALS — BP 119/84 | HR 72 | Wt 162.0 lb

## 2018-03-13 DIAGNOSIS — Z3403 Encounter for supervision of normal first pregnancy, third trimester: Secondary | ICD-10-CM

## 2018-03-13 DIAGNOSIS — Z3400 Encounter for supervision of normal first pregnancy, unspecified trimester: Secondary | ICD-10-CM

## 2018-03-13 DIAGNOSIS — Z3A37 37 weeks gestation of pregnancy: Secondary | ICD-10-CM

## 2018-03-13 NOTE — Patient Instructions (Signed)

## 2018-03-13 NOTE — Progress Notes (Signed)
   PRENATAL VISIT NOTE  Subjective:  Jody Cunningham is a 33 y.o. G1P0 at [redacted]w[redacted]d being seen today for ongoing prenatal care.  She is currently monitored for the following issues for this low-risk pregnancy and has Supervision of normal first pregnancy, antepartum and Rh negative state in antepartum period on their problem list.  Patient reports no complaints.  Contractions: Not present.  .  Movement: Present. Denies leaking of fluid.   The following portions of the patient's history were reviewed and updated as appropriate: allergies, current medications, past family history, past medical history, past social history, past surgical history and problem list.   Objective:   Vitals:   03/13/18 1006  BP: 119/84  Pulse: 72  Weight: 162 lb (73.5 kg)    Fetal Status: Fetal Heart Rate (bpm): 159 Fundal Height: 37 cm Movement: Present  Presentation: Vertex  General:  Alert, oriented and cooperative. Patient is in no acute distress.  Skin: Skin is warm and dry. No rash noted.   Cardiovascular: Normal heart rate noted  Respiratory: Normal respiratory effort, no problems with respiration noted  Abdomen: Soft, gravid, appropriate for gestational age.  Pain/Pressure: Present     Pelvic: Cervical exam performed Dilation: Fingertip Effacement (%): 90 Station: -2  Extremities: Normal range of motion.  Edema: Trace  Mental Status: Normal mood and affect. Normal behavior. Normal judgment and thought content.   Assessment and Plan:  Pregnancy: G1P0 at [redacted]w[redacted]d 1. Supervision of normal first pregnancy, antepartum Continue routine prenatal care.   Term labor symptoms and general obstetric precautions including but not limited to vaginal bleeding, contractions, leaking of fluid and fetal movement were reviewed in detail with the patient. Please refer to After Visit Summary for other counseling recommendations.   Return in 1 week (on 03/20/2018).  Future Appointments  Date Time Provider Department  Center  03/20/2018  4:00 PM Anyanwu, Jethro Bastos, MD [facility]  03/27/2018  3:00 PM Reva Bores, MD [facility]    Reva Bores, MD

## 2018-03-13 NOTE — Telephone Encounter (Signed)
Left message to call [facility] to schedule 3 8 Wk ROB visit for week 03/17/18 -

## 2018-03-20 ENCOUNTER — Ambulatory Visit (INDEPENDENT_AMBULATORY_CARE_PROVIDER_SITE_OTHER): Payer: BLUE CROSS/BLUE SHIELD | Admitting: Obstetrics & Gynecology

## 2018-03-20 ENCOUNTER — Other Ambulatory Visit: Payer: Self-pay

## 2018-03-20 VITALS — BP 121/80 | HR 70 | Wt 160.4 lb

## 2018-03-20 DIAGNOSIS — Z3A38 38 weeks gestation of pregnancy: Secondary | ICD-10-CM

## 2018-03-20 DIAGNOSIS — Z3403 Encounter for supervision of normal first pregnancy, third trimester: Secondary | ICD-10-CM

## 2018-03-20 DIAGNOSIS — Z3400 Encounter for supervision of normal first pregnancy, unspecified trimester: Secondary | ICD-10-CM

## 2018-03-20 NOTE — Progress Notes (Signed)
   PRENATAL VISIT NOTE  Subjective:  Jody Cunningham is a 33 y.o. G1P0 at [redacted]w[redacted]d being seen today for ongoing prenatal care.  She is currently monitored for the following issues for this high-risk pregnancy and has Supervision of normal first pregnancy, antepartum and Rh negative state in antepartum period on their problem list.  Patient reports no complaints.  Contractions: Not present. Vag. Bleeding: None.  Movement: Present. Denies leaking of fluid.   The following portions of the patient's history were reviewed and updated as appropriate: allergies, current medications, past family history, past medical history, past social history, past surgical history and problem list.   Objective:   Vitals:   03/20/18 1557  BP: 121/80  Pulse: 70  Weight: 160 lb 6.4 oz (72.8 kg)    Fetal Status: Fetal Heart Rate (bpm): 159 Fundal Height: 39 cm Movement: Present  Presentation: Vertex  General:  Alert, oriented and cooperative. Patient is in no acute distress.  Skin: Skin is warm and dry. No rash noted.   Cardiovascular: Normal heart rate noted  Respiratory: Normal respiratory effort, no problems with respiration noted  Abdomen: Soft, gravid, appropriate for gestational age.  Pain/Pressure: Present     Pelvic: Cervical exam performed Dilation: Fingertip Effacement (%): 60 Station: -2  Extremities: Normal range of motion.  Edema: Trace  Mental Status: Normal mood and affect. Normal behavior. Normal judgment and thought content.   Assessment and Plan:  Pregnancy: G1P0 at [redacted]w[redacted]d 1. Supervision of normal first pregnancy, antepartum Term labor symptoms and general obstetric precautions including but not limited to vaginal bleeding, contractions, leaking of fluid and fetal movement were reviewed in detail with the patient. Please refer to After Visit Summary for other counseling recommendations.   Return in about 1 week (around 03/27/2018) for OB Visit.  Future Appointments  Date Time Provider  Department Center  03/27/2018  3:00 PM Reva Bores, MD [facility]    [name], MD

## 2018-03-20 NOTE — Patient Instructions (Signed)
Return to office for any scheduled appointments. Call the office or go to the MAU at [facility] if:  You begin to have strong, frequent contractions  Your water breaks.  Sometimes it is a big gush of fluid, sometimes it is just a trickle that keeps getting your panties wet or running down your legs  You have vaginal bleeding.  It is normal to have a small amount of spotting if your cervix was checked.   You do not feel your baby moving like normal.  If you do not, get something to eat and drink and lay down and focus on feeling your baby move.   If your baby is still not moving like normal, you should call the office or go to MAU.  Any other obstetric concerns.   

## 2018-03-27 ENCOUNTER — Ambulatory Visit (INDEPENDENT_AMBULATORY_CARE_PROVIDER_SITE_OTHER): Payer: BLUE CROSS/BLUE SHIELD | Admitting: Family Medicine

## 2018-03-27 ENCOUNTER — Other Ambulatory Visit: Payer: Self-pay

## 2018-03-27 VITALS — BP 121/82 | HR 91 | Wt 160.0 lb

## 2018-03-27 DIAGNOSIS — Z3400 Encounter for supervision of normal first pregnancy, unspecified trimester: Secondary | ICD-10-CM

## 2018-03-27 DIAGNOSIS — Z3403 Encounter for supervision of normal first pregnancy, third trimester: Secondary | ICD-10-CM

## 2018-03-27 DIAGNOSIS — Z3A39 39 weeks gestation of pregnancy: Secondary | ICD-10-CM

## 2018-03-27 NOTE — Patient Instructions (Signed)

## 2018-03-27 NOTE — Progress Notes (Signed)
   PRENATAL VISIT NOTE  Subjective:  Jody Cunningham is a 33 y.o. G1P0 at [redacted]w[redacted]d being seen today for ongoing prenatal care.  She is currently monitored for the following issues for this low-risk pregnancy and has Supervision of normal first pregnancy, antepartum and Rh negative state in antepartum period on their problem list.  Patient reports no complaints.  Contractions: Not present. Vag. Bleeding: None.  Movement: Present. Denies leaking of fluid.   The following portions of the patient's history were reviewed and updated as appropriate: allergies, current medications, past family history, past medical history, past social history, past surgical history and problem list.   Objective:   Vitals:   03/27/18 1516  BP: 121/82  Pulse: 91  Weight: 160 lb (72.6 kg)    Fetal Status: Fetal Heart Rate (bpm): 161 Fundal Height: 36 cm Movement: Present  Presentation: Vertex  General:  Alert, oriented and cooperative. Patient is in no acute distress.  Skin: Skin is warm and dry. No rash noted.   Cardiovascular: Normal heart rate noted  Respiratory: Normal respiratory effort, no problems with respiration noted  Abdomen: Soft, gravid, appropriate for gestational age.  Pain/Pressure: Present     Pelvic: Cervical exam deferred Dilation: 1 Effacement (%): 90    Extremities: Normal range of motion.  Edema: Trace  Mental Status: Normal mood and affect. Normal behavior. Normal judgment and thought content.   Assessment and Plan:  Pregnancy: G1P0 at [redacted]w[redacted]d 1. Supervision of normal first pregnancy, antepartum Continue routine prenatal care. IOL scheduled at 1 wks Outpatient foley in MAU prior to IOL  Preterm labor symptoms and general obstetric precautions including but not limited to vaginal bleeding, contractions, leaking of fluid and fetal movement were reviewed in detail with the patient. Please refer to After Visit Summary for other counseling recommendations.   Return in 1 week (on  04/03/2018) for ob visit + NST.  Future Appointments  Date Time Provider Department Center  04/02/2018  8:15 AM Federico Flake, MD [facility]  04/06/2018  7:30 AM [facility] SCHED ROOM [facility] None    Reva Bores, MD

## 2018-03-30 ENCOUNTER — Encounter (HOSPITAL_COMMUNITY): Payer: Self-pay | Admitting: *Deleted

## 2018-03-30 ENCOUNTER — Inpatient Hospital Stay (HOSPITAL_COMMUNITY): Payer: BLUE CROSS/BLUE SHIELD | Admitting: Anesthesiology

## 2018-03-30 ENCOUNTER — Other Ambulatory Visit: Payer: Self-pay

## 2018-03-30 ENCOUNTER — Inpatient Hospital Stay (HOSPITAL_COMMUNITY)
Admission: AD | Admit: 2018-03-30 | Discharge: 2018-04-02 | DRG: 807 | Disposition: A | Discharge status: Home / Self Care | Payer: BLUE CROSS/BLUE SHIELD | Attending: Obstetrics and Gynecology | Admitting: Obstetrics and Gynecology

## 2018-03-30 DIAGNOSIS — O26893 Other specified pregnancy related conditions, third trimester: Secondary | ICD-10-CM | POA: Diagnosis present

## 2018-03-30 DIAGNOSIS — O4292 Full-term premature rupture of membranes, unspecified as to length of time between rupture and onset of labor: Principal | ICD-10-CM | POA: Diagnosis present

## 2018-03-30 DIAGNOSIS — Z6791 Unspecified blood type, Rh negative: Secondary | ICD-10-CM | POA: Diagnosis not present

## 2018-03-30 DIAGNOSIS — O26899 Other specified pregnancy related conditions, unspecified trimester: Secondary | ICD-10-CM

## 2018-03-30 DIAGNOSIS — O480 Post-term pregnancy: Secondary | ICD-10-CM | POA: Diagnosis present

## 2018-03-30 DIAGNOSIS — Z3A40 40 weeks gestation of pregnancy: Secondary | ICD-10-CM

## 2018-03-30 DIAGNOSIS — Z3A41 41 weeks gestation of pregnancy: Secondary | ICD-10-CM

## 2018-03-30 LAB — POCT FERN TEST: POCT Fern Test: POSITIVE

## 2018-03-30 LAB — CBC
HCT: 34.9 % — ABNORMAL LOW (ref 36.0–46.0)
Hemoglobin: 11.5 g/dL — ABNORMAL LOW (ref 12.0–15.0)
MCH: 30.2 pg (ref 26.0–34.0)
MCHC: 33 g/dL (ref 30.0–36.0)
MCV: 91.6 fL (ref 80.0–100.0)
NRBC: 0 % (ref 0.0–0.2)
Platelets: 312 10*3/uL (ref 150–400)
RBC: 3.81 MIL/uL — ABNORMAL LOW (ref 3.87–5.11)
RDW: 13.6 % (ref 11.5–15.5)
WBC: 14.4 10*3/uL — ABNORMAL HIGH (ref 4.0–10.5)

## 2018-03-30 MED ORDER — LACTATED RINGERS IV SOLN
500.0000 mL | INTRAVENOUS | Status: DC | PRN
  Administered 2018-03-30 (×2): 500 mL via INTRAVENOUS

## 2018-03-30 MED ORDER — ACETAMINOPHEN 325 MG PO TABS: 650.0000 mg | ORAL_TABLET | ORAL | Status: DC | PRN

## 2018-03-30 MED ORDER — LIDOCAINE HCL (PF) 1 % IJ SOLN: 30.0000 mL | INTRAMUSCULAR | Status: DC | PRN

## 2018-03-30 MED ORDER — SODIUM CHLORIDE (PF) 0.9 % IJ SOLN
INTRAMUSCULAR | Status: DC | PRN
  Administered 2018-03-30: 12 mL/h via EPIDURAL

## 2018-03-30 MED ORDER — EPHEDRINE 5 MG/ML INJ: 10.0000 mg | INTRAVENOUS | Status: DC | PRN

## 2018-03-30 MED ORDER — LACTATED RINGERS IV SOLN
INTRAVENOUS | Status: DC
  Administered 2018-03-30 – 2018-03-31 (×4): via INTRAVENOUS

## 2018-03-30 MED ORDER — LACTATED RINGERS IV SOLN
500.0000 mL | Freq: Once | INTRAVENOUS | Status: AC
  Administered 2018-03-30: 500 mL via INTRAVENOUS

## 2018-03-30 MED ORDER — OXYCODONE-ACETAMINOPHEN 5-325 MG PO TABS: 2.0000 | ORAL_TABLET | ORAL | Status: DC | PRN

## 2018-03-30 MED ORDER — OXYTOCIN 40 UNITS IN NORMAL SALINE INFUSION - SIMPLE MED
1.0000 m[IU]/min | INTRAVENOUS | Status: DC
  Administered 2018-03-31: 2 m[IU]/min via INTRAVENOUS
  Filled 2018-03-30: qty 1000

## 2018-03-30 MED ORDER — PHENYLEPHRINE 40 MCG/ML (10ML) SYRINGE FOR IV PUSH (FOR BLOOD PRESSURE SUPPORT)
80.0000 ug | PREFILLED_SYRINGE | INTRAVENOUS | Status: DC | PRN
  Filled 2018-03-30: qty 10

## 2018-03-30 MED ORDER — PHENYLEPHRINE 40 MCG/ML (10ML) SYRINGE FOR IV PUSH (FOR BLOOD PRESSURE SUPPORT): 80.0000 ug | PREFILLED_SYRINGE | INTRAVENOUS | Status: DC | PRN

## 2018-03-30 MED ORDER — ONDANSETRON HCL 4 MG/2ML IJ SOLN: 4.0000 mg | Freq: Four times a day (QID) | INTRAMUSCULAR | Status: DC | PRN

## 2018-03-30 MED ORDER — OXYTOCIN BOLUS FROM INFUSION
500.0000 mL | Freq: Once | INTRAVENOUS | Status: AC
  Administered 2018-03-31: 500 mL via INTRAVENOUS

## 2018-03-30 MED ORDER — LIDOCAINE HCL (PF) 1 % IJ SOLN
INTRAMUSCULAR | Status: DC | PRN
  Administered 2018-03-30: 5 mL via EPIDURAL

## 2018-03-30 MED ORDER — SOD CITRATE-CITRIC ACID 500-334 MG/5ML PO SOLN: 30.0000 mL | ORAL | Status: DC | PRN

## 2018-03-30 MED ORDER — OXYTOCIN BOLUS FROM INFUSION: 500.0000 mL | Freq: Once | INTRAVENOUS | Status: DC

## 2018-03-30 MED ORDER — FLEET ENEMA 7-19 GM/118ML RE ENEM: 1.0000 | ENEMA | RECTAL | Status: DC | PRN

## 2018-03-30 MED ORDER — OXYCODONE-ACETAMINOPHEN 5-325 MG PO TABS: 1.0000 | ORAL_TABLET | ORAL | Status: DC | PRN

## 2018-03-30 MED ORDER — OXYTOCIN 40 UNITS IN NORMAL SALINE INFUSION - SIMPLE MED: 2.5000 [IU]/h | INTRAVENOUS | Status: DC

## 2018-03-30 MED ORDER — TERBUTALINE SULFATE 1 MG/ML IJ SOLN: 0.2500 mg | Freq: Once | INTRAMUSCULAR | Status: DC | PRN

## 2018-03-30 MED ORDER — LACTATED RINGERS IV SOLN: INTRAVENOUS | Status: DC

## 2018-03-30 MED ORDER — LACTATED RINGERS IV SOLN: 500.0000 mL | INTRAVENOUS | Status: DC | PRN

## 2018-03-30 MED ORDER — MISOPROSTOL 50MCG HALF TABLET
ORAL_TABLET | ORAL | Status: AC
  Filled 2018-03-30: qty 1

## 2018-03-30 MED ORDER — FENTANYL-BUPIVACAINE-NACL 0.5-0.125-0.9 MG/250ML-% EP SOLN
12.0000 mL/h | EPIDURAL | Status: DC | PRN
  Filled 2018-03-30: qty 250

## 2018-03-30 MED ORDER — FENTANYL CITRATE (PF) 100 MCG/2ML IJ SOLN
INTRAMUSCULAR | Status: AC
  Administered 2018-03-30: 100 ug via INTRAVENOUS
  Filled 2018-03-30: qty 2

## 2018-03-30 MED ORDER — FENTANYL CITRATE (PF) 100 MCG/2ML IJ SOLN
100.0000 ug | INTRAMUSCULAR | Status: DC | PRN
  Administered 2018-03-30 (×2): 100 ug via INTRAVENOUS
  Filled 2018-03-30: qty 2

## 2018-03-30 MED ORDER — DIPHENHYDRAMINE HCL 50 MG/ML IJ SOLN: 12.5000 mg | INTRAMUSCULAR | Status: DC | PRN

## 2018-03-30 MED ORDER — MISOPROSTOL 50MCG HALF TABLET
50.0000 ug | ORAL_TABLET | ORAL | Status: DC
  Administered 2018-03-30: 50 ug via BUCCAL

## 2018-03-30 NOTE — Anesthesia Procedure Notes (Signed)
Epidural Patient location during procedure: OB Start time: 03/30/2018 5:32 PM End time: 03/30/2018 5:46 PM  Staffing Anesthesiologist: Trevor Iha, MD Performed: anesthesiologist   Preanesthetic Checklist Completed: patient identified, site marked, surgical consent, pre-op evaluation, timeout performed, IV checked, risks and benefits discussed and monitors and equipment checked  Epidural Patient position: sitting Prep: site prepped and draped and DuraPrep Patient monitoring: continuous pulse ox and blood pressure Approach: midline Location: L3-L4 Injection technique: LOR air  Needle:  Needle type: Tuohy  Needle gauge: 17 G Needle length: 9 cm and 9 Needle insertion depth: 6 cm Catheter type: closed end flexible Catheter size: 19 Gauge Catheter at skin depth: 11 cm Test dose: negative  Assessment Events: blood not aspirated, injection not painful, no injection resistance, negative IV test and no paresthesia  Additional Notes Patient identified. Risks/Benefits/Options discussed with patient including but not limited to bleeding, infection, nerve damage, paralysis, failed block, incomplete pain control, headache, blood pressure changes, nausea, vomiting, reactions to medication both or allergic, itching and postpartum back pain. Confirmed with bedside nurse the patient's most recent platelet count. Confirmed with patient that they are not currently taking any anticoagulation, have any bleeding history or any family history of bleeding disorders. Patient expressed understanding and wished to proceed. All questions were answered. Sterile technique was used throughout the entire procedure. Please see nursing notes for vital signs. Test dose was given through epidural needle and negative prior to continuing to dose epidural or start infusion. Warning signs of high block given to the patient including shortness of breath, tingling/numbness in hands, complete motor block, or any  concerning symptoms with instructions to call for help. Patient was given instructions on fall risk and not to get out of bed. All questions and concerns addressed with instructions to call with any issues. 1 Attempt (S) . Patient tolerated procedure well.

## 2018-03-30 NOTE — MAU Note (Signed)
Pt states she has been having contractions between 8-10 minutes apart.  She then started having some clear, pink tinged discharge that is kind of mucusy.  Denies complications, reports good fetal movement.

## 2018-03-30 NOTE — Progress Notes (Signed)
Labor Progress Note Ziaira Hake is a 33 y.o. G1P0 at [redacted]w[redacted]d presented for PROM  S:  Patient comfortable with epidural  O:  BP 117/68   Pulse 80   Temp 98.4 F (36.9 C) (Oral)   Resp 17   Ht 5\' 8"  (1.727 m)   Wt 72.6 kg   LMP 06/23/2017 (Within Days)   SpO2 100%   BMI 24.33 kg/m   Fetal Tracing:  Baseline: 150 Variability: moderate Accels: 15x15 Decels: none  Toco: 2-4   CVE: Dilation: 7 Effacement (%): 90 Cervical Position: Middle Station: 0 Presentation: Vertex Exam by:: Cleone Slim, CNM   A&P: 33 y.o. G1P0 [redacted]w[redacted]d PROM #Labor: Progressing well. Will continue expectant management and recheck in 2-3 hours. Will augment with pitocin if no change #Pain:  epidural #FWB: Cat1 #GBS negative  Rolm Bookbinder, CNM 8:59 PM

## 2018-03-30 NOTE — Progress Notes (Signed)
[facility]: Labor Progress Note  Subjective: Feeling stronger, more painful contractions every 1-2 minutes. FOB at bedside for support  Objective: BP 129/76   Pulse 68   Temp 98.4 F (36.9 C) (Oral)   Resp 18   Ht 5\' 8"  (1.727 m)   Wt 72.6 kg   LMP 06/23/2017 (Within Days)   SpO2 100%   BMI 24.33 kg/m  Gen: very uncomfortable appearing  Dilation: 4 Effacement (%): 80 Cervical Position: Posterior Station: -2 Presentation: Vertex Exam by:: Dr Darin Engels  Assessment and Plan: 33 y.o. G1P0 [redacted]w[redacted]d here for PROM  Labor: good cervical change with 1 dose cytotec, contracting q1-2 minutes, will hold off on pitocin and manage expectantly, will start pitocin if contractions begin to space out -- pain control: desires epidural now -- PPH Risk: low  Fetal Well-Being: Cephalic by exam.  -- Category 1 - continuous fetal monitoring  -- GBS negative  Burman Nieves, MD Family Medicine Resident 5:22 PM

## 2018-03-30 NOTE — Anesthesia Preprocedure Evaluation (Addendum)
Anesthesia Evaluation  Patient identified by MRN, date of birth, ID band Patient awake    Reviewed: Allergy & Precautions, NPO status , Patient's Chart, lab work & pertinent test results  Airway Mallampati: II  TM Distance: >3 FB Neck ROM: Full    Dental no notable dental hx. (+) Teeth Intact   Pulmonary neg pulmonary ROS,    Pulmonary exam normal breath sounds clear to auscultation       Cardiovascular Exercise Tolerance: Good Normal cardiovascular exam Rhythm:Regular Rate:Normal     Neuro/Psych negative neurological ROS     GI/Hepatic negative GI ROS, Neg liver ROS,   Endo/Other    Renal/GU negative Renal ROS     Musculoskeletal   Abdominal   Peds  Hematology Hgb 11.5 Plt 312   Anesthesia Other Findings   Reproductive/Obstetrics (+) Pregnancy                             Anesthesia Physical Anesthesia Plan  ASA: II  Anesthesia Plan: Epidural   Post-op Pain Management:    Induction:   PONV Risk Score and Plan:   Airway Management Planned:   Additional Equipment:   Intra-op Plan:   Post-operative Plan:   Informed Consent: I have reviewed the patients History and Physical, chart, labs and discussed the procedure including the risks, benefits and alternatives for the proposed anesthesia with the patient or authorized representative who has indicated his/her understanding and acceptance.       Plan Discussed with:   Anesthesia Plan Comments: (40 Wk G1P0 for LEA)        Anesthesia Quick Evaluation

## 2018-03-30 NOTE — Progress Notes (Signed)
Vtx by bedside US. 

## 2018-03-30 NOTE — H&P (Addendum)
OBSTETRIC ADMISSION HISTORY AND PHYSICAL  Jody Cunningham is a 33 y.o. female G1P0 with IUP at [redacted]w[redacted]d by LMP presenting for PROM (positive fern in MAU). Has noted slow leakage of clear fluid since around 4am. Reports fetal movement. Denies vaginal bleeding. Not feeling strong contractions at this time.   She received her prenatal care at [facility].  Support person in labor: husband  Ultrasounds . Anatomy U/S: normal with mild bilateral renal pyelectasis, resolved on follow up US 4 weeks later  Prenatal History/Complications: . None  Past Medical History: History reviewed. No pertinent past medical history.  Past Surgical History: Past Surgical History:  Procedure Laterality Date  . TONSILLECTOMY     Obstetrical History: OB History    Gravida  1   Para      Term      Preterm      AB      Living        SAB      TAB      Ectopic      Multiple      Live Births              Social History: Social History   Socioeconomic History  . Marital status: Married    Spouse name: Not on file  . Number of children: Not on file  . Years of education: Not on file  . Highest education level: Not on file  Occupational History  . Not on file  Social Needs  . Financial resource strain: Not on file  . Food insecurity:    Worry: Not on file    Inability: Not on file  . Transportation needs:    Medical: Not on file    Non-medical: Not on file  Tobacco Use  . Smoking status: Never Smoker  . Smokeless tobacco: Never Used  Substance and Sexual Activity  . Alcohol use: Not Currently    Comment: socially  . Drug use: Never  . Sexual activity: Yes    Partners: Male    Birth control/protection: None  Lifestyle  . Physical activity:    Days per week: Not on file    Minutes per session: Not on file  . Stress: Not on file  Relationships  . Social connections:    Talks on phone: Not on file    Gets together: Not on file    Attends religious service: Not on  file    Active member of club or organization: Not on file    Attends meetings of clubs or organizations: Not on file    Relationship status: Not on file  Other Topics Concern  . Not on file  Social History Narrative  . Not on file    Family History: Family History  Problem Relation Age of Onset  . Cancer Maternal Grandmother     Allergies: Allergies  Allergen Reactions  . Ceclor [Cefaclor] Rash    Medications Prior to Admission  Medication Sig Dispense Refill Last Dose  . Prenat-Methylfol-Chol-Fish Oil (PRENATAL + COMPLETE MULTI PO) Take 1 tablet by mouth daily.   Past Week at Unknown time  . promethazine (PHENERGAN) 25 MG tablet Take 1 tablet (25 mg total) by mouth every 6 (six) hours as needed for nausea or vomiting. (Patient not taking: Reported on 11/27/2017) 30 tablet 2 More than a month at Unknown time     Review of Systems  All systems reviewed and negative except as stated in HPI  Blood pressure (P) 132/74, pulse  72, temperature 98.1 F (36.7 C), temperature source Oral, resp. rate 17, last menstrual period 06/23/2017, SpO2 100 %. General appearance: alert, cooperative and no distress Lungs: no respiratory distress Heart: regular rate  Abdomen: gravid abdomen Extremities: No LE edema Presentation: cephalic Fetal monitoring: baseline 150bpm, good variability, +accels, no decels Uterine activity: 3-42min   Prenatal labs: ABO, Rh: O/Negative/-- (09/04 1557) Antibody: Negative (01/14 0848) Rubella: 3.42 (09/04 1557) RPR: Non Reactive (01/14 0848)  HBsAg: Negative (09/04 1557)  HIV: Non Reactive (01/14 0848)  GBS: Negative (03/05 1630)  Glucola: normal Genetic screening:  NIPS low risk  Prenatal Transfer Tool  Maternal Diabetes: No Genetic Screening: Normal Maternal Ultrasounds/Referrals: Normal Fetal Ultrasounds or other Referrals:  None Maternal Substance Abuse:  No Significant Maternal Medications:  None Significant Maternal Lab Results: Lab values  include: Group B Strep negative, Rh negative  Results for orders placed or performed during the hospital encounter of 03/30/18 (from the past 24 hour(s))  POCT fern test   Collection Time: 03/30/18 10:52 AM  Result Value Ref Range   POCT Fern Test Positive = ruptured amniotic membanes     Patient Active Problem List   Diagnosis Date Noted  . Rh negative state in antepartum period 09/05/2017  . Supervision of normal first pregnancy, antepartum 09/04/2017    Assessment/Plan:  Jody Cunningham is a 33 y.o. G1P0 at [redacted]w[redacted]d here for SROM.  Labor: buccal cytotec placed, consider FB at next check  -- pain control: desires epidural in active labor  Fetal Wellbeing: Cephalic by [place] in MAU  -- GBS (negative) -- continuous fetal monitoring - Cat 1  Postpartum Planning -- breast/POPs -- Rh(-), s/p Rhogam on 1/14 -- given Tdap   Burman Nieves, MD Family Medicine Resident   Attestation: I have seen this patient and agree with the resident's documentation. We have discussed the plan of care.  Cristal Deer. Earlene Plater, DO OB/GYN Fellow

## 2018-03-31 ENCOUNTER — Encounter (HOSPITAL_COMMUNITY): Payer: Self-pay

## 2018-03-31 DIAGNOSIS — Z3A40 40 weeks gestation of pregnancy: Secondary | ICD-10-CM

## 2018-03-31 DIAGNOSIS — O480 Post-term pregnancy: Secondary | ICD-10-CM

## 2018-03-31 LAB — RPR: RPR Ser Ql: NONREACTIVE

## 2018-03-31 MED ORDER — SIMETHICONE 80 MG PO CHEW: 80.0000 mg | CHEWABLE_TABLET | ORAL | Status: DC | PRN

## 2018-03-31 MED ORDER — COCONUT OIL OIL
1.0000 "application " | TOPICAL_OIL | Status: DC | PRN
  Administered 2018-04-01: 1 via TOPICAL

## 2018-03-31 MED ORDER — WITCH HAZEL-GLYCERIN EX PADS: 1.0000 "application " | MEDICATED_PAD | CUTANEOUS | Status: DC | PRN

## 2018-03-31 MED ORDER — PRENATAL MULTIVITAMIN CH
1.0000 | ORAL_TABLET | Freq: Every day | ORAL | Status: DC
  Administered 2018-03-31 – 2018-04-01 (×2): 1 via ORAL
  Filled 2018-03-31 (×2): qty 1

## 2018-03-31 MED ORDER — ZOLPIDEM TARTRATE 5 MG PO TABS: 5.0000 mg | ORAL_TABLET | Freq: Every evening | ORAL | Status: DC | PRN

## 2018-03-31 MED ORDER — DIBUCAINE 1 % RE OINT: 1.0000 "application " | TOPICAL_OINTMENT | RECTAL | Status: DC | PRN

## 2018-03-31 MED ORDER — ONDANSETRON HCL 4 MG PO TABS: 4.0000 mg | ORAL_TABLET | ORAL | Status: DC | PRN

## 2018-03-31 MED ORDER — SENNOSIDES-DOCUSATE SODIUM 8.6-50 MG PO TABS
2.0000 | ORAL_TABLET | ORAL | Status: DC
  Administered 2018-04-01 (×2): 2 via ORAL
  Filled 2018-03-31 (×2): qty 2

## 2018-03-31 MED ORDER — ONDANSETRON HCL 4 MG/2ML IJ SOLN: 4.0000 mg | INTRAMUSCULAR | Status: DC | PRN

## 2018-03-31 MED ORDER — TETANUS-DIPHTH-ACELL PERTUSSIS 5-2.5-18.5 LF-MCG/0.5 IM SUSP: 0.5000 mL | Freq: Once | INTRAMUSCULAR | Status: DC

## 2018-03-31 MED ORDER — DIPHENHYDRAMINE HCL 25 MG PO CAPS: 25.0000 mg | ORAL_CAPSULE | Freq: Four times a day (QID) | ORAL | Status: DC | PRN

## 2018-03-31 MED ORDER — ACETAMINOPHEN 325 MG PO TABS: 650.0000 mg | ORAL_TABLET | ORAL | Status: DC | PRN

## 2018-03-31 MED ORDER — BENZOCAINE-MENTHOL 20-0.5 % EX AERO
1.0000 "application " | INHALATION_SPRAY | CUTANEOUS | Status: DC | PRN
  Administered 2018-04-01: 1 via TOPICAL
  Filled 2018-03-31: qty 56

## 2018-03-31 MED ORDER — IBUPROFEN 600 MG PO TABS
600.0000 mg | ORAL_TABLET | Freq: Four times a day (QID) | ORAL | Status: DC
  Administered 2018-03-31 – 2018-04-02 (×8): 600 mg via ORAL
  Filled 2018-03-31 (×7): qty 1

## 2018-03-31 NOTE — Discharge Summary (Addendum)
Postpartum Discharge Summary     Patient Name: Jody Cunningham DOB: 1985-07-01 MRN: [id number]  Date of admission: 03/30/2018 Delivering Provider: Rolm Bookbinder   Date of discharge: 04/02/2018  Admitting diagnosis: [redacted] wks pregnant,discharge, CTX Intrauterine pregnancy: [redacted]w[redacted]d     Secondary diagnosis:  Active Problems:   Rh negative state in antepartum period   Indication for care in labor or delivery   Post term pregnancy at [redacted] weeks gestation  Additional problems: prolonged second stage     Discharge diagnosis: Term Pregnancy Delivered                                                                                                Post partum procedures: none  Augmentation: Pitocin and Cytotec  Complications: None  Hospital course:  Onset of Labor With Vaginal Delivery     33 y.o. yo G1P0 at [redacted]w[redacted]d was admitted in Latent Labor on 03/30/2018. Patient had an uncomplicated labor course as follows:  Membrane Rupture Time/Date: 5:00 AM ,03/30/2018   Intrapartum Procedures: Episiotomy: None [1]                                         Lacerations:  1st degree [2]  Patient had a delivery of a Viable infant. 03/31/2018  Information for the patient's newborn:  Terra, Kimmey Girl Aransa [[id number]]  Delivery Method: Vag-Spont   Labor and delivery complicated by prolonged second stage.  Pateint had an uncomplicated postpartum course.  She is ambulating, tolerating a regular diet, passing flatus, and urinating well. Patient is discharged home in stable condition on 04/02/18.   Magnesium Sulfate recieved: No BMZ received: No  Physical exam  Vitals:   04/01/18 0540 04/01/18 1438 04/01/18 2235 04/02/18 0600  BP: 119/83 125/65 126/80 121/77  Pulse: 70 64 69 (!) 55  Resp: 17 18 18 18   Temp: 97.9 F (36.6 C) 98.3 F (36.8 C) 98.6 F (37 C) 97.8 F (36.6 C)  TempSrc: Oral Oral Oral Oral  SpO2: 99%     Weight:      Height:       General: alert Lochia: appropriate Uterine  Fundus: firm Incision: N/A DVT Evaluation: No cords or calf tenderness. No significant calf/ankle edema. Labs: Lab Results  Component Value Date   WBC 18.3 (H) 04/01/2018   HGB 9.5 (L) 04/01/2018   HCT 29.1 (L) 04/01/2018   MCV 92.7 04/01/2018   PLT 274 04/01/2018   No flowsheet data found.  Discharge instruction: per After Visit Summary and "Baby and Me Booklet".  After visit meds:  Allergies as of 04/02/2018      Reactions   Ceclor [cefaclor] Rash      Medication List    STOP taking these medications   promethazine 25 MG tablet Commonly known as:  PHENERGAN     TAKE these medications   PRENATAL + COMPLETE MULTI PO Take 1 tablet by mouth daily.       Diet: routine diet  Activity: Advance as tolerated. Pelvic rest  for 6 weeks.   Outpatient follow up:6 weeks Follow up Appt: Future Appointments  Date Time Provider Department Center  04/29/2018  1:45 PM Reva Bores, MD [facility]   Follow up Visit: Follow-up Information    [facility]. Schedule an appointment as soon as possible for a visit.           Please schedule this patient for PP visit in: 4 weeks Low risk pregnancy complicated by: n/a Delivery mode:  SVD Anticipated Birth Control:  POPs PP Procedures needed: n/a  Schedule Integrated BH visit: no Provider: Any provider  Newborn Data: Live born female  Birth Weight:   APGAR: 8, 8  Newborn Delivery   Birth date/time:  03/31/2018 05:39:00 Delivery type:  Vaginal, Spontaneous     Baby Feeding: Bottle and Breast Disposition:home with mother

## 2018-03-31 NOTE — Progress Notes (Addendum)
Labor Progress Note Jody Cunningham is a 33 y.o. G1P0 at [redacted]w[redacted]d presented for PROM  S:  Patient reporting intermittent rectal pressure  O:  BP 133/78   Pulse 97   Temp 98.7 F (37.1 C) (Oral)   Resp 17   Ht 5\' 8"  (1.727 m)   Wt 72.6 kg   LMP 06/23/2017 (Within Days)   SpO2 100%   BMI 24.33 kg/m   Fetal Tracing:  Baseline: 155 Variability: moderate Accels: 15x15  Decels: none  Toco: 2-3   CVE: Dilation: 10 Dilation Complete Date: 03/31/18 Dilation Complete Time: 0010 Effacement (%): 90 Cervical Position: Middle Station: Plus 1 Presentation: Vertex Exam by:: Cleone Slim, CNM   A&P: 33 y.o. G1P0 [redacted]w[redacted]d PROM #Labor: Progressing well. Will start pushing #Pain: epidural #FWB: Cat 1 #GBS negative  Rolm Bookbinder, CNM

## 2018-03-31 NOTE — Anesthesia Postprocedure Evaluation (Signed)
Anesthesia Post Note  Patient: Jody Cunningham  Procedure(s) Performed: AN AD HOC LABOR EPIDURAL     Patient location during evaluation: Mother Baby Anesthesia Type: Epidural Level of consciousness: awake and alert, oriented and patient cooperative Pain management: pain level controlled Vital Signs Assessment: post-procedure vital signs reviewed and stable Respiratory status: spontaneous breathing and nonlabored ventilation Cardiovascular status: blood pressure returned to baseline and stable Postop Assessment: no headache, no backache and able to ambulate Anesthetic complications: no    Last Vitals:  Vitals:   03/31/18 0900 03/31/18 0959  BP: 124/71 119/72  Pulse: 67 73  Resp: 16 16  Temp: 36.7 C 37 C  SpO2: 96% 97%    Last Pain:  Vitals:   03/31/18 0959  TempSrc: Oral  PainSc: 0-No pain   Pain Goal: Patients Stated Pain Goal: 8 (03/30/18 1212)                 Sherrie George [facility]

## 2018-03-31 NOTE — Lactation Note (Signed)
This note was copied from a baby's chart. Lactation Consultation Note  Patient Name: Jody Cunningham OHYWV'P Date: 03/31/2018 Reason for consult: Initial assessment;Term;Primapara;1st time breastfeeding  P1 mother whose infant is now 41 hours old.  RN in room assisting with latch when I arrived.  Baby was latched onto the left breast in the football hold and was rhythmically sucking.  Lips were flanged and mother denied pain.  The baby was feeding well.  Discussed breast feeding basics with parents while baby was feeding.  Mother's breasts are soft and non tender and nipples are almost completely flat.  Breast tissue is soft and compressible.  Breast shells and manual pump provided with instructions to help evert nipples.  #24 flange size is appropriate for mother at this time. Discussed cleaning of the pump parts.  Taught hand expression and mother was able to express colostrum drops which I finger fed to baby.  Colostrum container provided and milk storage times reviewed.  Finger feeding demonstrated.  Encouraged to feed 8-12 times/24 hours or sooner if baby shows feeding cues.  Reviewed cues.  Encouraged STS with all feeds.  Baby released and I demonstrated burping.  Baby still showing cues so I assisted to latch in the football hold on the right breast without difficulty.  Breast compressions demonstrated.  Mother will be a "stay at home" mother and does not have a DEBP at this time.  Suggested she call her insurance company to obtain a pump for home use.  Mother plans on doing this.  She will call for latch assistance as needed.  Father present.   Maternal Data Formula Feeding for Exclusion: No Has patient been taught Hand Expression?: Yes Does the patient have breastfeeding experience prior to this delivery?: No  Feeding Feeding Type: Breast Fed  LATCH Score Latch: Grasps breast easily, tongue down, lips flanged, rhythmical sucking.  Audible Swallowing: A few with  stimulation  Type of Nipple: Flat  Comfort (Breast/Nipple): Soft / non-tender  Hold (Positioning): Assistance needed to correctly position infant at breast and maintain latch.  LATCH Score: 7  Interventions Interventions: Breast feeding basics reviewed;Assisted with latch;Skin to skin;Breast massage;Hand express;Pre-pump if needed;Breast compression;Hand pump;Shells;Position options;Support pillows;Adjust position  Lactation Tools Discussed/Used WIC Program: No Pump Review: Setup, frequency, and cleaning;Milk Storage Initiated by:: Eulene Pekar Date initiated:: 03/31/18   Consult Status Consult Status: Follow-up Date: 04/01/18 Follow-up type: In-patient    Dora Sims 03/31/2018, 9:36 AM

## 2018-03-31 NOTE — Progress Notes (Signed)
Labor Progress Note Jody Cunningham is a 33 y.o. G1P0 at [redacted]w[redacted]d presented for PROM  S:  Patient tired from pushing  O:  BP 133/78   Pulse 97   Temp 98.7 F (37.1 C) (Oral)   Resp 17   Ht 5\' 8"  (1.727 m)   Wt 72.6 kg   LMP 06/23/2017 (Within Days)   SpO2 100%   BMI 24.33 kg/m   Fetal Tracing:  Baseline: 160 Variability: moderate Accels: 15x15 Decels: variables with pushing  Toco: 2-3   CVE: Dilation: 10 Dilation Complete Date: 03/31/18 Dilation Complete Time: 0010 Effacement (%): 90 Cervical Position: Middle Station: Plus 1 Presentation: Vertex Exam by:: Cleone Slim, CNM   A&P: 33 y.o. G1P0 [redacted]w[redacted]d PROM #Labor: Patient has pushed for 2.5 hours with minimal progress. Will labor down. #Pain: epidural #FWB: Cat 1 #GBS negative  Rolm Bookbinder, CNM 3:01 AM

## 2018-04-01 LAB — CBC
HCT: 29.1 % — ABNORMAL LOW (ref 36.0–46.0)
Hemoglobin: 9.5 g/dL — ABNORMAL LOW (ref 12.0–15.0)
MCH: 30.3 pg (ref 26.0–34.0)
MCHC: 32.6 g/dL (ref 30.0–36.0)
MCV: 92.7 fL (ref 80.0–100.0)
Platelets: 274 10*3/uL (ref 150–400)
RBC: 3.14 MIL/uL — ABNORMAL LOW (ref 3.87–5.11)
RDW: 14.1 % (ref 11.5–15.5)
WBC: 18.3 10*3/uL — ABNORMAL HIGH (ref 4.0–10.5)
nRBC: 0 % (ref 0.0–0.2)

## 2018-04-01 MED ORDER — RHO D IMMUNE GLOBULIN 1500 UNIT/2ML IJ SOSY
300.0000 ug | PREFILLED_SYRINGE | Freq: Once | INTRAMUSCULAR | Status: AC
  Administered 2018-04-01: 300 ug via INTRAVENOUS
  Filled 2018-04-01: qty 2

## 2018-04-01 NOTE — Lactation Note (Signed)
This note was copied from a baby's chart. Lactation Consultation Note  Patient Name: Jody Cunningham [id number]'W Date: 04/01/2018 Reason for consult: Follow-up assessment  Mom pumped, but nothing was yielded. Mom's hand expression technique was refined. She is currently expressing into a colostrum vial so we have milk on hand to feed infant.    [name] [facility] 04/01/2018, 10:57 AM

## 2018-04-01 NOTE — Progress Notes (Signed)
Post Partum Day 1 Subjective: no complaints, up ad lib, voiding and tolerating PO  Objective: Blood pressure 119/83, pulse 70, temperature 97.9 F (36.6 C), temperature source Oral, resp. rate 17, height 5\' 8"  (1.727 m), weight 72.6 kg, last menstrual period 06/23/2017, SpO2 99 %, unknown if currently breastfeeding.  Physical Exam:  General: alert, cooperative and appears stated age Lochia: appropriate Uterine Fundus: firm DVT Evaluation: No evidence of DVT seen on physical exam.  Recent Labs    03/30/18 1129 04/01/18 0606  HGB 11.5* 9.5*  HCT 34.9* 29.1*    Assessment/Plan: Plan for discharge tomorrow, Breastfeeding and Contraception POPs  Needs Rhogam prior to discharge   LOS: 2 days   Reva Bores 04/01/2018, 9:50 AM

## 2018-04-01 NOTE — Lactation Note (Signed)
This note was copied from a baby's chart. Lactation Consultation Note  Patient Name: Girl Sumar Virts ZWCHE'N Date: 04/01/2018   Infant noted to be gnawing on hand when I entered room & Mom was noted to have a lot of colostrum. Infant was spoon-fed w/ease with 4 mL of colostrum & then put to breast.  Mom's nipples are invaginated; they will evert, but then they flatten/invert slightly as the breast is being sandwiched. Hand expression was successful in bringing the nipples out some, as was using the manual pump, but each time, the nipples would flatten. Infant can latch with these nipples, but there is no transfer of milk noted. A nipple shield (size 24) was applied & prefilled with Mom's colostrum, but infant soon fell asleep.  Mom was set up with a DEBP.LC to return. Olivia Mackie, Treena Cosman [facility] 04/01/2018, 10:32 AM

## 2018-04-01 NOTE — Lactation Note (Signed)
This note was copied from a baby's chart. Lactation Consultation Note  Patient Name: Jody Cunningham Date: 04/01/2018 Reason for consult: Follow-up assessment  Infant to the breast with the nipple shield. Infant still not transferring milk, despite breast compression.   Infant came off breast & appeared too sleepy to try latching again, but "Jody Cunningham" was sucking on her arm. Dad was taught how to finger-feed infant.  Plan: Mom is to hand express again in the hopes of having enough to supplement at breast with 5Fr/syringe.   Jody Cunningham [facility] 04/01/2018, 1:58 PM

## 2018-04-01 NOTE — Lactation Note (Signed)
This note was copied from a baby's chart. Lactation Consultation Note  Patient Name: Girl Deah Otsuka [id number] Date: 04/01/2018   Infant is 78 hrs old. Since Mom's breasts were beginning to get sore from expression attempts & infant seems unable to draw Mom's colostrum from the breast, Mom was ready to begin supplementing with formula. The infant was supplemented at the breast (with 5Fr/syringe) with the nipple shield.  "[name]" was given EBM & formula, for a total intake of 39ml.   Parents feel comfortable with mode of supplementation at this time. Dad understands how to clean 5Fr/syringe. Parents were also provided an extra 5Fr & a 35-mL syringe. As Mom has been working diligently, she will likely take a break for a couple of feedings from expressing her milk, but she understands the importance of resuming expression later this evening so as to augment her supply. I anticipate that once Mom's milk comes to volume, [name] will be able to adequately transfer at the breast.   Lactation to f/u tomorrow.   [name] [facility] 04/01/2018, 3:15 PM

## 2018-04-02 ENCOUNTER — Encounter: Payer: BLUE CROSS/BLUE SHIELD | Admitting: Family Medicine

## 2018-04-02 LAB — RH IG WORKUP (INCLUDES ABO/RH)
ABO/RH(D): O NEG
Fetal Screen: NEGATIVE
Gestational Age(Wks): 40.1
UNIT DIVISION: 0

## 2018-04-02 MED ORDER — BREAST PUMP MISC: 0 refills | Status: DC

## 2018-04-02 MED ORDER — NORETHINDRONE 0.35 MG PO TABS: 1.0000 | ORAL_TABLET | Freq: Every day | ORAL | 11 refills | Status: DC

## 2018-04-02 NOTE — Lactation Note (Signed)
This note was copied from a baby's chart. Lactation Consultation Note  Patient Name: Jody Cunningham Date: 04/02/2018   Mom has some questions about tubing for the pump they had rented.    [name] [facility] 04/02/2018, 1:55 PM

## 2018-04-02 NOTE — Discharge Instructions (Signed)
Vaginal Delivery, Care After °Refer to this sheet in the next few weeks. These instructions provide you with information about caring for yourself after vaginal delivery. Your health care provider may also give you more specific instructions. Your treatment has been planned according to current medical practices, but problems sometimes occur. Call your health care provider if you have any problems or questions. °What can I expect after the procedure? °After vaginal delivery, it is common to have: °· Some bleeding from your vagina. °· Soreness in your abdomen, your vagina, and the area of skin between your vaginal opening and your anus (perineum). °· Pelvic cramps. °· Fatigue. °Follow these instructions at home: °Medicines °· Take over-the-counter and prescription medicines only as told by your health care provider. °· If you were prescribed an antibiotic medicine, take it as told by your health care provider. Do not stop taking the antibiotic until it is finished. °Driving ° °· Do not drive or operate heavy machinery while taking prescription pain medicine. °· Do not drive for 24 hours if you received a sedative. °Lifestyle °· Do not drink alcohol. This is especially important if you are breastfeeding or taking medicine to relieve pain. °· Do not use tobacco products, including cigarettes, chewing tobacco, or e-cigarettes. If you need help quitting, ask your health care provider. °Eating and drinking °· Drink at least 8 eight-ounce glasses of water every day unless you are told not to by your health care provider. If you choose to breastfeed your baby, you may need to drink more water than this. °· Eat high-fiber foods every day. These foods may help prevent or relieve constipation. High-fiber foods include: °? Whole grain cereals and breads. °? Brown rice. °? Beans. °? Fresh fruits and vegetables. °Activity °· Return to your normal activities as told by your health care provider. Ask your health care provider what  activities are safe for you. °· Rest as much as possible. Try to rest or take a nap when your baby is sleeping. °· Do not lift anything that is heavier than your baby or 10 lb (4.5 kg) until your health care provider says that it is safe. °· Talk with your health care provider about when you can engage in sexual activity. This may depend on your: °? Risk of infection. °? Rate of healing. °? Comfort and desire to engage in sexual activity. °Vaginal Care °· If you have an episiotomy or a vaginal tear, check the area every day for signs of infection. Check for: °? More redness, swelling, or pain. °? More fluid or blood. °? Warmth. °? Pus or a bad smell. °· Do not use tampons or douches until your health care provider says this is safe. °· Watch for any blood clots that may pass from your vagina. These may look like clumps of dark red, brown, or black discharge. °General instructions °· Keep your perineum clean and dry as told by your health care provider. °· Wear loose, comfortable clothing. °· Wipe from front to back when you use the toilet. °· Ask your health care provider if you can shower or take a bath. If you had an episiotomy or a perineal tear during labor and delivery, your health care provider may tell you not to take baths for a certain length of time. °· Wear a bra that supports your breasts and fits you well. °· If possible, have someone help you with household activities and help care for your baby for at least a few days after you   leave the hospital. °· Keep all follow-up visits for you and your baby as told by your health care provider. This is important. °Contact a health care provider if: °· You have: °? Vaginal discharge that has a bad smell. °? Difficulty urinating. °? Pain when urinating. °? A sudden increase or decrease in the frequency of your bowel movements. °? More redness, swelling, or pain around your episiotomy or vaginal tear. °? More fluid or blood coming from your episiotomy or vaginal  tear. °? Pus or a bad smell coming from your episiotomy or vaginal tear. °? A fever. °? A rash. °? Little or no interest in activities you used to enjoy. °? Questions about caring for yourself or your baby. °· Your episiotomy or vaginal tear feels warm to the touch. °· Your episiotomy or vaginal tear is separating or does not appear to be healing. °· Your breasts are painful, hard, or turn red. °· You feel unusually sad or worried. °· You feel nauseous or you vomit. °· You pass large blood clots from your vagina. If you pass a blood clot from your vagina, save it to show to your health care provider. Do not flush blood clots down the toilet without having your health care provider look at them. °· You urinate more than usual. °· You are dizzy or light-headed. °· You have not breastfed at all and you have not had a menstrual period for 12 weeks after delivery. °· You have stopped breastfeeding and you have not had a menstrual period for 12 weeks after you stopped breastfeeding. °Get help right away if: °· You have: °? Pain that does not go away or does not get better with medicine. °? Chest pain. °? Difficulty breathing. °? Blurred vision or spots in your vision. °? Thoughts about hurting yourself or your baby. °· You develop pain in your abdomen or in one of your legs. °· You develop a severe headache. °· You faint. °· You bleed from your vagina so much that you fill two sanitary pads in one hour. °This information is not intended to replace advice given to you by your health care provider. Make sure you discuss any questions you have with your health care provider. °Document Released: 12/16/1999 Document Revised: 06/01/2015 Document Reviewed: 01/02/2015 °Elsevier Interactive Patient Education © 2019 Elsevier Inc. ° °

## 2018-04-02 NOTE — Lactation Note (Signed)
This note was copied from a baby's chart. Lactation Consultation Note  Patient Name: Jody Cunningham [id number] Date: 04/02/2018   Parents will rent a DEBP from the gift shop (phone # was provided) as they wait for their DEBP from insurance to arrive. Pumping whenever infant receives bottle/formula was emphasized (Mom has not pumped since yesterday). Parents understand to wash pump parts after every use & to sanitize once/day (hwo to sanitize was discussed).   [name] recently took her 2nd bottle & did well, per parents. Parents have some different brands of bottles at home. They know to try one that says slow-flow & to use same positioning as we did here. Parents said they have Enfamil powdered & ready-to-feed formula at home.   [name] [facility] 04/02/2018, 12:23 PM

## 2018-04-02 NOTE — Lactation Note (Signed)
This note was copied from a baby's chart. Lactation Consultation Note  Patient Name: Jody Cunningham [id number] Date: 04/02/2018   I spoke with parents about introducing a bottle instead of supplementing at breast, as [name]'s volumes were less than I expected. [name] drank 60ml w/ease using a slow-flow nipple (Similac).   Mom does not yet have a pump at home, but is contacting [employer] today to determine its delivery. Parents will call for me to return once they have info so we will know how to proceed with instruction for Mom expressing her milk at home.  Mom's breasts do feel a little bit heavier today.  [name] [facility] 04/02/2018, 9:20 AM

## 2018-04-03 LAB — TYPE AND SCREEN
ABO/RH(D): O NEG
Antibody Screen: POSITIVE
Unit division: 0
Unit division: 0

## 2018-04-03 LAB — BPAM RBC
Blood Product Expiration Date: 202004152359
Blood Product Expiration Date: 202004152359
UNIT TYPE AND RH: 9500
Unit Type and Rh: 9500

## 2018-04-06 ENCOUNTER — Inpatient Hospital Stay (HOSPITAL_COMMUNITY): Payer: BLUE CROSS/BLUE SHIELD

## 2018-04-08 ENCOUNTER — Encounter: Payer: Self-pay | Admitting: *Deleted

## 2018-04-14 ENCOUNTER — Encounter: Payer: Self-pay | Admitting: Radiology

## 2018-04-21 ENCOUNTER — Encounter: Payer: Self-pay | Admitting: *Deleted

## 2018-04-29 ENCOUNTER — Other Ambulatory Visit: Payer: Self-pay

## 2018-04-29 ENCOUNTER — Ambulatory Visit (INDEPENDENT_AMBULATORY_CARE_PROVIDER_SITE_OTHER): Payer: BLUE CROSS/BLUE SHIELD | Admitting: Family Medicine

## 2018-04-29 DIAGNOSIS — Z1389 Encounter for screening for other disorder: Secondary | ICD-10-CM | POA: Diagnosis not present

## 2018-04-29 NOTE — Progress Notes (Signed)
    [facility]LE[facility]ALTH [facility] POSTPARTUM VISIT ENCOUNTER NOTE  I connected with Jerzi Fritzinger  on 04/29/18 at  1:45 PM EDT via WebEx at home and verified that I am speaking with the correct person using two identifiers.   I discussed the limitations, risks, security and privacy concerns of performing an evaluation and management service by telephone and the availability of in person appointments. I also discussed with the patient that there may be a patient responsible charge related to this service. The patient expressed understanding and agreed to proceed.  Appointment Date: 04/29/2018  OB[facility]N Clinic: [facility]  Chief Complaint: pp check Chief Complaint  Patient presents with  . Postpartum Care    History of Present Illness:  Delone Mclarty is a 33 y.o. G30P1001 female who presents for a postpartum visit. She is 4 weeks postpartum following a spontaneous vaginal delivery. I have fully reviewed the prenatal and intrapartum course. The delivery was at 40.1 gestational weeks.  Anesthesia: epidural. Postpartum course has been uncomplicated. Baby's course has been uncopmplicated. Baby is feeding by breast. Bleeding staining only. Bowel function is normal. Bladder function is normal. Patient is not sexually active. Contraception method is OCP (estrogen/progesterone). Postpartum depression screening:neg  Review of Systems:  Her 12 point review of systems is negative or as noted in the History of Present Illness.  Patient Active Problem List   Diagnosis Date Noted  . Indication for care in labor or delivery 03/30/2018  . Post term pregnancy at [redacted] weeks gestation 03/30/2018  . Rh negative state in antepartum period 09/05/2017  . Supervision of normal first pregnancy, antepartum 09/04/2017    Medications Siiri Duguid had no medications administered during this visit. Current Outpatient Medications  Medication Sig Dispense Refill  . Prenat-Methylfol-Chol-Fish Oil (PRENATAL + COMPLETE  MULTI PO) Take 1 tablet by mouth daily.    . Misc. Devices (BREAST PUMP) MISC Dispense one breast pump for patient (Patient not taking: Reported on 04/29/2018) 1 each 0  . norethindrone (MICRONOR,CAMILA,ERRIN) 0.35 MG tablet Take 1 tablet (0.35 mg total) by mouth daily. (Patient not taking: Reported on 04/29/2018) 1 Package 11   No current facility-administered medications for this visit.     Allergies Ceclor [cefaclor]  Physical Exam:  There were no vitals taken for this visit.  General:  Alert, oriented and cooperative. Patient is in no acute distress.  Mental Status: Normal mood and affect. Normal behavior. Normal judgment and thought content.   Respiratory: Normal respiratory effort noted, no problems with respiration noted  Rest of physical exam deferred due to type of encounter  Assessment:Patient is a 33 y.o. G1P1001 who is 4 weeks postpartum from a normal spontaneous vaginal delivery.  She is doing well.   Plan: For pap  I discussed the assessment and treatment plan with the patient. The patient was provided an opportunity to ask questions and all were answered. The patient agreed with the plan and demonstrated an understanding of the instructions.   The patient was advised to call back or seek an in-person evaluation/go to the ED for any concerning postpartum symptoms.  I provided  minutes of face-to-face time during this encounter.   Reva Bores, MD [facility] [employer], [facility]

## 2018-04-29 NOTE — Progress Notes (Signed)
I connected with  Jody Cunningham on 04/29/18 at  1:45 PM EDT by telephone and verified that I am speaking with the correct person using two identifiers.   I discussed the limitations, risks, security and privacy concerns of performing an evaluation and management service by telephone and the availability of in person appointments. I also discussed with the patient that there may be a patient responsible charge related to this service. The patient expressed understanding and agreed to proceed.  Scheryl Marten, RN 04/29/2018  2:09 PM

## 2018-06-17 ENCOUNTER — Encounter: Payer: Self-pay | Admitting: Radiology

## 2018-07-17 ENCOUNTER — Encounter: Payer: Self-pay | Admitting: Radiology

## 2018-12-04 ENCOUNTER — Ambulatory Visit: Payer: BLUE CROSS/BLUE SHIELD | Admitting: Obstetrics and Gynecology

## 2019-01-08 ENCOUNTER — Ambulatory Visit: Payer: BC Managed Care – PPO | Admitting: Obstetrics and Gynecology

## 2019-01-08 DIAGNOSIS — Z01419 Encounter for gynecological examination (general) (routine) without abnormal findings: Secondary | ICD-10-CM

## 2019-03-26 ENCOUNTER — Encounter: Payer: Self-pay | Admitting: Family Medicine

## 2019-03-26 ENCOUNTER — Other Ambulatory Visit: Payer: Self-pay

## 2019-03-26 ENCOUNTER — Ambulatory Visit (INDEPENDENT_AMBULATORY_CARE_PROVIDER_SITE_OTHER): Payer: BC Managed Care – PPO | Admitting: Family Medicine

## 2019-03-26 VITALS — BP 120/78 | HR 69 | Temp 97.1°F | Ht 68.5 in | Wt 159.0 lb

## 2019-03-26 DIAGNOSIS — Z1329 Encounter for screening for other suspected endocrine disorder: Secondary | ICD-10-CM | POA: Diagnosis not present

## 2019-03-26 DIAGNOSIS — Z13220 Encounter for screening for lipoid disorders: Secondary | ICD-10-CM | POA: Diagnosis not present

## 2019-03-26 DIAGNOSIS — Z0000 Encounter for general adult medical examination without abnormal findings: Secondary | ICD-10-CM

## 2019-03-26 DIAGNOSIS — Z8616 Personal history of COVID-19: Secondary | ICD-10-CM | POA: Insufficient documentation

## 2019-03-26 HISTORY — DX: Personal history of COVID-19: Z86.16

## 2019-03-26 NOTE — Patient Instructions (Signed)
Preventive Care 21-33 Years Old, Female Preventive care refers to visits with your health care provider and lifestyle choices that can promote health and wellness. This includes:  A yearly physical exam. This may also be called an annual well check.  Regular dental visits and eye exams.  Immunizations.  Screening for certain conditions.  Healthy lifestyle choices, such as eating a healthy diet, getting regular exercise, not using drugs or products that contain nicotine and tobacco, and limiting alcohol use. What can I expect for my preventive care visit? Physical exam Your health care provider will check your:  Height and weight. This may be used to calculate body mass index (BMI), which tells if you are at a healthy weight.  Heart rate and blood pressure.  Skin for abnormal spots. Counseling Your health care provider may ask you questions about your:  Alcohol, tobacco, and drug use.  Emotional well-being.  Home and relationship well-being.  Sexual activity.  Eating habits.  Work and work environment.  Method of birth control.  Menstrual cycle.  Pregnancy history. What immunizations do I need?  Influenza (flu) vaccine  This is recommended every year. Tetanus, diphtheria, and pertussis (Tdap) vaccine  You may need a Td booster every 10 years. Varicella (chickenpox) vaccine  You may need this if you have not been vaccinated. Human papillomavirus (HPV) vaccine  If recommended by your health care provider, you may need three doses over 6 months. Measles, mumps, and rubella (MMR) vaccine  You may need at least one dose of MMR. You may also need a second dose. Meningococcal conjugate (MenACWY) vaccine  One dose is recommended if you are age 19-21 years and a first-year college student living in a residence hall, or if you have one of several medical conditions. You may also need additional booster doses. Pneumococcal conjugate (PCV13) vaccine  You may need  this if you have certain conditions and were not previously vaccinated. Pneumococcal polysaccharide (PPSV23) vaccine  You may need one or two doses if you smoke cigarettes or if you have certain conditions. Hepatitis A vaccine  You may need this if you have certain conditions or if you travel or work in places where you may be exposed to hepatitis A. Hepatitis B vaccine  You may need this if you have certain conditions or if you travel or work in places where you may be exposed to hepatitis B. Haemophilus influenzae type b (Hib) vaccine  You may need this if you have certain conditions. You may receive vaccines as individual doses or as more than one vaccine together in one shot (combination vaccines). Talk with your health care provider about the risks and benefits of combination vaccines. What tests do I need?  Blood tests  Lipid and cholesterol levels. These may be checked every 5 years starting at age 20.  Hepatitis C test.  Hepatitis B test. Screening  Diabetes screening. This is done by checking your blood sugar (glucose) after you have not eaten for a while (fasting).  Sexually transmitted disease (STD) testing.  BRCA-related cancer screening. This may be done if you have a family history of breast, ovarian, tubal, or peritoneal cancers.  Pelvic exam and Pap test. This may be done every 3 years starting at age 21. Starting at age 30, this may be done every 5 years if you have a Pap test in combination with an HPV test. Talk with your health care provider about your test results, treatment options, and if necessary, the need for more tests.   Follow these instructions at home: Eating and drinking   Eat a diet that includes fresh fruits and vegetables, whole grains, lean protein, and low-fat dairy.  Take vitamin and mineral supplements as recommended by your health care provider.  Do not drink alcohol if: ? Your health care provider tells you not to drink. ? You are  pregnant, may be pregnant, or are planning to become pregnant.  If you drink alcohol: ? Limit how much you have to 0-1 drink a day. ? Be aware of how much alcohol is in your drink. In the [place] one drink equals one 12 oz bottle of beer (355 mL), one 5 oz glass of wine (148 mL), or one 1 oz glass of hard liquor (44 mL). Lifestyle  Take daily care of your teeth and gums.  Stay active. Exercise for at least 30 minutes on 5 or more days each week.  Do not use any products that contain nicotine or tobacco, such as cigarettes, e-cigarettes, and chewing tobacco. If you need help quitting, ask your health care provider.  If you are sexually active, practice safe sex. Use a condom or other form of birth control (contraception) in order to prevent pregnancy and STIs (sexually transmitted infections). If you plan to become pregnant, see your health care provider for a preconception visit. What's next?  Visit your health care provider once a year for a well check visit.  Ask your health care provider how often you should have your eyes and teeth checked.  Stay up to date on all vaccines. This information is not intended to replace advice given to you by your health care provider. Make sure you discuss any questions you have with your health care provider. Document Revised: 08/29/2017 Document Reviewed: 08/29/2017 Elsevier Patient Education  2020 [employer].

## 2019-03-26 NOTE — Progress Notes (Signed)
Subjective:    Patient ID: Jody Cunningham, female    DOB: 27-Aug-1985, 34 y.o.   MRN: [id number]  HPI Chief Complaint  Patient presents with  . new pt    new pt, get established. no concerns.    She is new to the practice and here to establish care. Moved from [place] 2018  Previous medical care: in [place] and with OB/GYN here No CPE with a PCP in years.   Other providers: OB/GYN- [facility]   She has not concerns today.   History of Covid infection that was a fairly mild case right after Christmas. Her husband and daughter all had it.  She had the first dose of Pfizer vaccine one week ago.   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 Bachelors degree from [place].  Denies smoking, drinking alcohol, drug use  Diet: fairly healthy  Excerise: occasional   Immunizations: she had her flu shot and Tdap   Health maintenance:  Last Gynecological Exam: pap smear 3 years ago  Last Menstrual cycle: 03/23//2021 Pregnancies: 1 No birth control  Last Dental Exam: years ago  Last Eye Exam: years ago   Wears seatbelt always, uses sunscreen, smoke detectors in home and functioning, does not text while driving and feels safe in home environment.   Reviewed allergies, medications, past medical, surgical, family, and social history.    Review of Systems Review of Systems Constitutional: -fever, -chills, -sweats, -unexpected weight change,-fatigue ENT: -runny nose, -ear pain, -sore throat Cardiology:  -chest pain, -palpitations, -edema Respiratory: -cough, -shortness of breath, -wheezing Gastroenterology: -abdominal pain, -nausea, -vomiting, -diarrhea, -constipation  Hematology: -bleeding or bruising problems Musculoskeletal: -arthralgias, -myalgias, -joint swelling, -back pain Ophthalmology: -vision changes Urology: -dysuria, -difficulty urinating, -hematuria, -urinary frequency,  -urgency Neurology: -headache, -weakness, -tingling, -numbness       Objective:   Physical Exam BP 120/78   Pulse 69   Temp (!) 97.1 F (36.2 C)   Ht 5' 8.5" (1.74 m)   Wt 159 lb (72.1 kg)   LMP 03/24/2019   Breastfeeding No   BMI 23.82 kg/m   General Appearance:    Alert, cooperative, no distress, appears stated age  Head:    Normocephalic, without obvious abnormality, atraumatic  Eyes:    PERRL, conjunctiva/corneas clear, EOM's intact  Ears:    Normal TM's and external ear canals  Nose:  Mask in place  Throat:  Mask in place  Neck:   Supple, no lymphadenopathy;  thyroid:  no   enlargement/tenderness/nodules; no JVD  Back:     ROM normal, no CVA     tenderness  Lungs:     Clear to auscultation bilaterally without wheezes, rales or     ronchi; respirations unlabored  Chest Wall:    No tenderness or deformity   Heart:    Regular rate and rhythm, S1 and S2 normal, no murmur, rub   or gallop  Breast Exam:   OB/GYN  Abdomen:     Soft, non-tender, nondistended, normoactive bowel sounds,    no masses, no hepatosplenomegaly  Genitalia:   OB/GYN  Rectal:    Not performed due to age<40 and no related complaints  Extremities:   No clubbing, cyanosis or edema  Pulses:   2+ and symmetric all extremities  Skin:   Skin color, texture, turgor normal, no rashes or lesions  Lymph nodes:   Cervical, supraclavicular, and axillary nodes normal  Neurologic:  CNII-XII intact, normal strength, sensation and gait          Psych:   Normal mood, affect, hygiene and grooming.          Assessment & Plan:  Routine general medical examination at a health care facility - Plan: CBC with Differential/Platelet, Comprehensive metabolic panel, TSH, T4, free, T3, Lipid panel  History of COVID-19  Screening for thyroid disorder - Plan: TSH, T4, free, T3  Screening for lipid disorders - Plan: Lipid panel  She is a pleasant 34 year old female who is new to the practice and here to establish care.   She is also here for her fasting CPE. She does not have any significant past medical or surgical history. She seems to be taking good care of herself. We discussed healthy lifestyle including diet and exercise. Reviewed preventive health care and she plans to see her OB/GYN for her Pap smear which is due. Immunizations are up-to-date.  She has received her first Pfizer COVID-19 vaccine. Discussed safety and health promotion. Follow-up pending labs or in 1 year for her annual wellness

## 2019-03-27 LAB — LIPID PANEL
Chol/HDL Ratio: 3 ratio (ref 0.0–4.4)
Cholesterol, Total: 165 mg/dL (ref 100–199)
HDL: 55 mg/dL (ref >39)
LDL Chol Calc (NIH): 93 mg/dL (ref 0–99)
Triglycerides: 92 mg/dL (ref 0–149)
VLDL Cholesterol Cal: 17 mg/dL (ref 5–40)

## 2019-03-27 LAB — COMPREHENSIVE METABOLIC PANEL
ALT: 20 IU/L (ref 0–32)
AST: 19 IU/L (ref 0–40)
Albumin/Globulin Ratio: 2 (ref 1.2–2.2)
Albumin: 4.7 g/dL (ref 3.8–4.8)
Alkaline Phosphatase: 65 IU/L (ref 39–117)
BUN/Creatinine Ratio: 11 (ref 9–23)
BUN: 8 mg/dL (ref 6–20)
Bilirubin Total: 0.3 mg/dL (ref 0.0–1.2)
CO2: 23 mmol/L (ref 20–29)
Calcium: 8.8 mg/dL (ref 8.7–10.2)
Chloride: 105 mmol/L (ref 96–106)
Creatinine, Ser: 0.75 mg/dL (ref 0.57–1.00)
GFR calc Af Amer: 122 mL/min/{1.73_m2} (ref >59)
GFR calc non Af Amer: 106 mL/min/{1.73_m2} (ref >59)
Globulin, Total: 2.3 g/dL (ref 1.5–4.5)
Glucose: 88 mg/dL (ref 65–99)
Potassium: 4.2 mmol/L (ref 3.5–5.2)
Sodium: 138 mmol/L (ref 134–144)
Total Protein: 7 g/dL (ref 6.0–8.5)

## 2019-03-27 LAB — T4, FREE: Free T4: 1.25 ng/dL (ref 0.82–1.77)

## 2019-03-27 LAB — CBC WITH DIFFERENTIAL/PLATELET
Basophils Absolute: 0 10*3/uL (ref 0.0–0.2)
Basos: 1 %
EOS (ABSOLUTE): 0 10*3/uL (ref 0.0–0.4)
Eos: 1 %
Hematocrit: 41.6 % (ref 34.0–46.6)
Hemoglobin: 14.1 g/dL (ref 11.1–15.9)
Immature Grans (Abs): 0 10*3/uL (ref 0.0–0.1)
Immature Granulocytes: 0 %
Lymphocytes Absolute: 2 10*3/uL (ref 0.7–3.1)
Lymphs: 34 %
MCH: 30.9 pg (ref 26.6–33.0)
MCHC: 33.9 g/dL (ref 31.5–35.7)
MCV: 91 fL (ref 79–97)
Monocytes Absolute: 0.3 10*3/uL (ref 0.1–0.9)
Monocytes: 6 %
Neutrophils Absolute: 3.4 10*3/uL (ref 1.4–7.0)
Neutrophils: 58 %
Platelets: 320 10*3/uL (ref 150–450)
RBC: 4.56 x10E6/uL (ref 3.77–5.28)
RDW: 12.3 % (ref 11.7–15.4)
WBC: 5.8 10*3/uL (ref 3.4–10.8)

## 2019-03-27 LAB — TSH: TSH: 0.994 u[IU]/mL (ref 0.450–4.500)

## 2019-03-27 LAB — T3: T3, Total: 133 ng/dL (ref 71–180)

## 2019-08-04 IMAGING — US US MFM OB COMP +14 WKS
1 series · 13 of 28 positions shown · non-contrast
Comparison: none

[Series 1: us mfm ob comp +14 wks · 95 acquisitions, 13 frames shown]
[im 4/95]
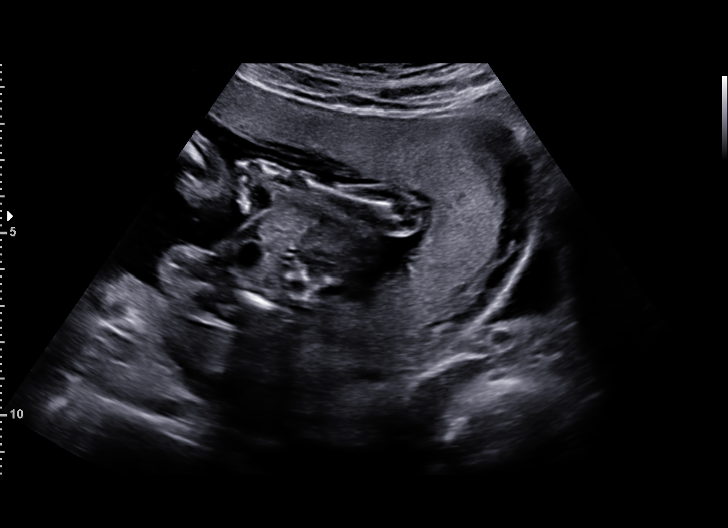
[im 11/95]
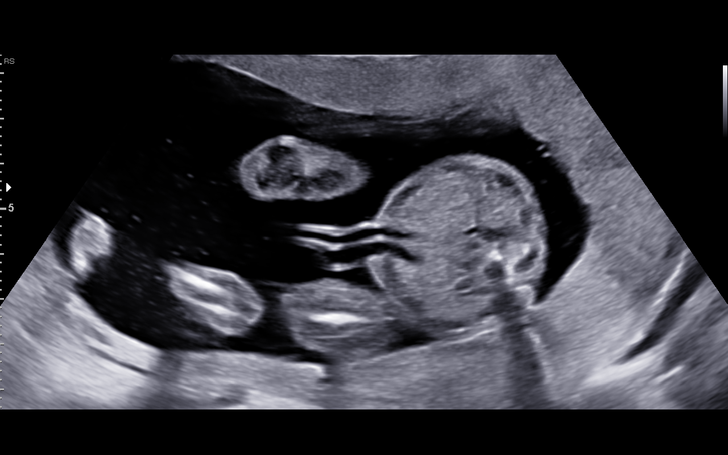
[im 18/95]
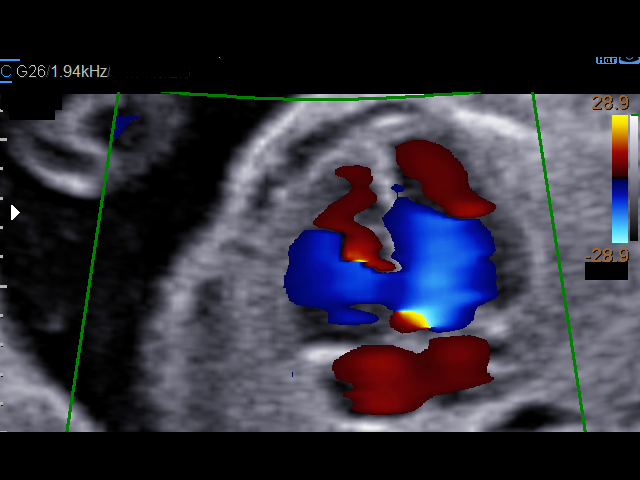
[im 25/95]
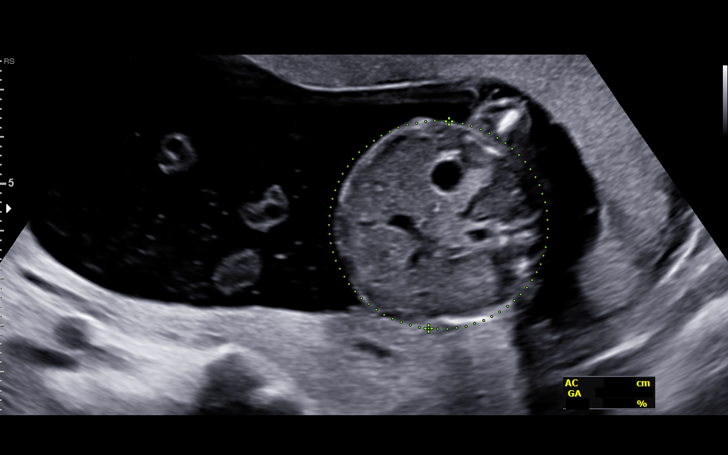
[im 32/95]
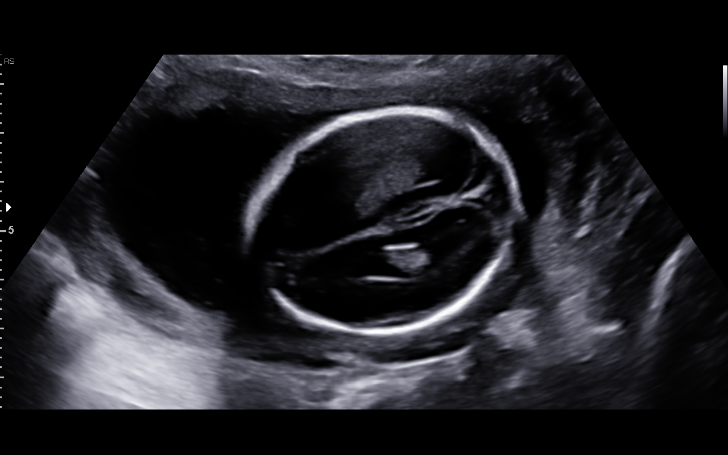
[im 39/95]
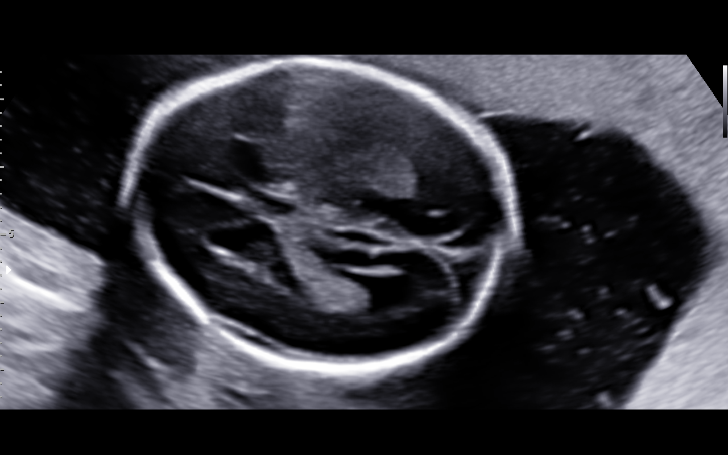
[im 49/95]
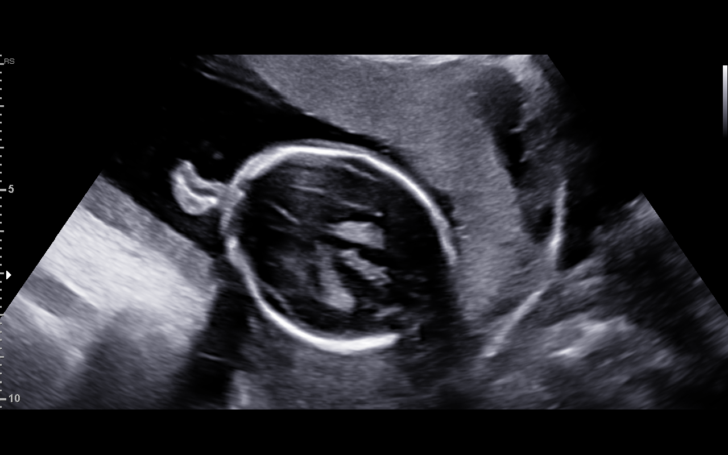
[im 56/95]
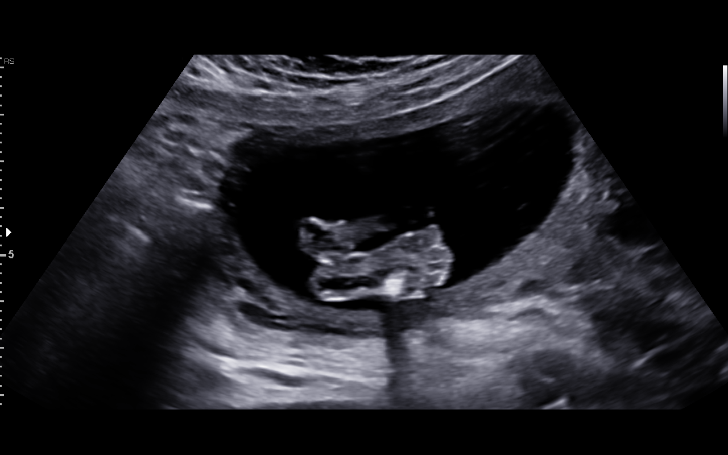
[im 63/95]
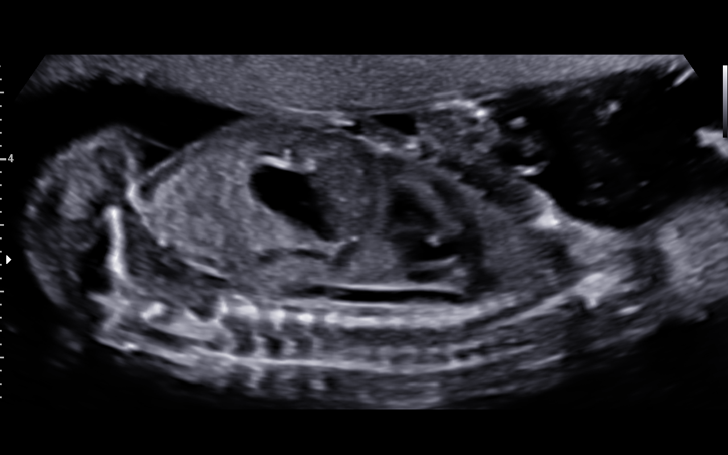
[im 70/95]
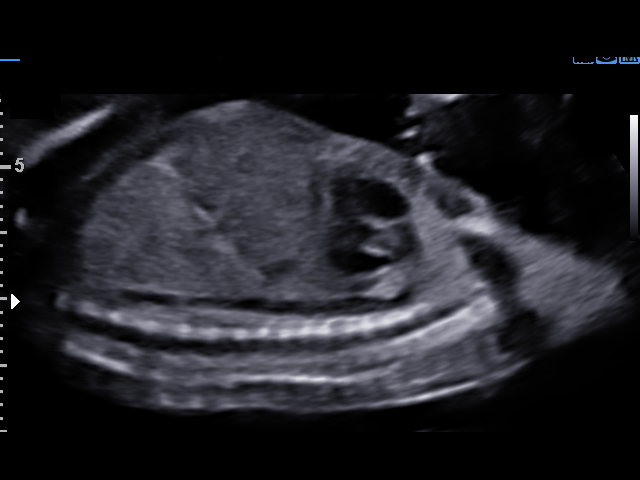
[im 77/95]
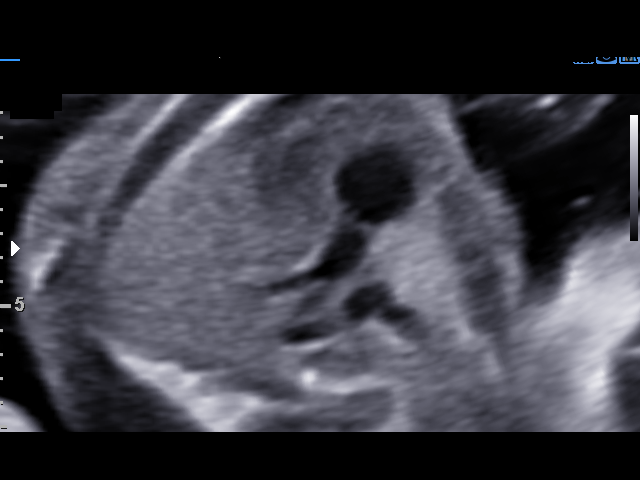
[im 84/95]
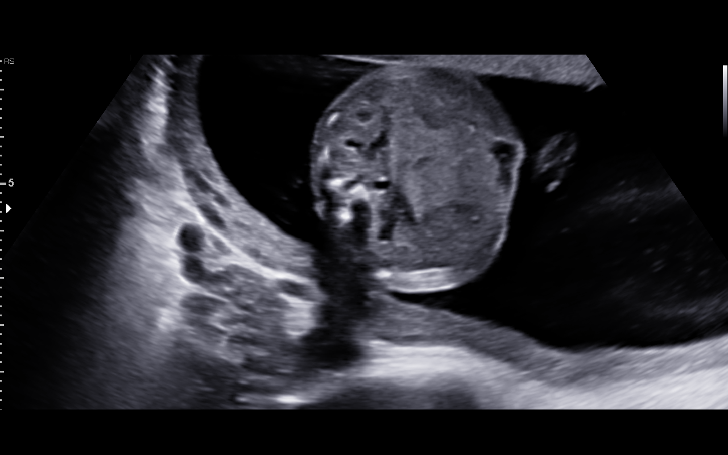
[im 91/95]
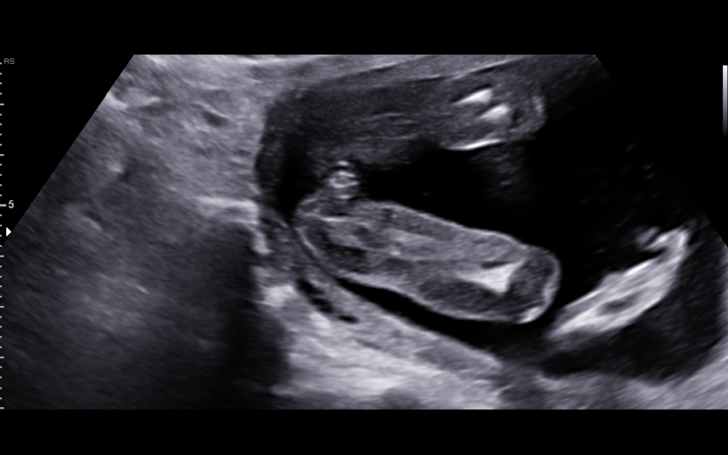

[13 of 28 positions shown; findings below may reference images not displayed]

[facility] [[facility]OSP[facility]]
                   BERNDT CNM

  1  US MFM OB COMP + 14 WK               76805.01     ASURUMA AMIKKO
 ----------------------------------------------------------------------

 ----------------------------------------------------------------------
Indications

  Encounter for antenatal screening for
  malformations
  19 weeks gestation of pregnancy
 ----------------------------------------------------------------------
Fetal Evaluation

 Num Of Fetuses:         1
 Fetal Heart Rate(bpm):  171
 Cardiac Activity:       Observed
 Presentation:           Transverse, head to maternal left
 Placenta:               Anterior
 P. Cord Insertion:      Visualized, central

 Amniotic Fluid
 AFI FV:      Within normal limits

                             Largest Pocket(cm)

Biometry

 BPD:      44.8  mm     G. Age:  19w 4d         55  %    CI:        67.52   %    70 - 86
                                                         FL/HC:      16.7   %    16.1 -
 HC:      174.5  mm     G. Age:  20w 0d         68  %    HC/AC:      1.19        1.09 -
 AC:      146.3  mm     G. Age:  20w 0d         62  %    FL/BPD:     65.2   %
 FL:       29.2  mm     G. Age:  19w 0d         28  %    FL/AC:      20.0   %    20 - 24
 HUM:      27.8  mm     G. Age:  18w 6d         38  %
 CER:      20.5  mm     G. Age:  19w 4d         51  %
 NFT:       4.7  mm

 CM:          3  mm
 Est. FW:     298  gm    0 lb 11 oz      49  %
OB History

 Gravidity:    1
Gestational Age

 LMP:           19w 3d        Date:  06/23/17                 EDD:   03/30/18
 U/S Today:     19w 5d                                        EDD:   03/28/18
 Best:          19w 3d     Det. By:  LMP  (06/23/17)          EDD:   03/30/18
Anatomy

 Cranium:               Appears normal         Aortic Arch:            Appears normal
 Cavum:                 Appears normal         Ductal Arch:            Appears normal
 Ventricles:            Appears normal         Diaphragm:              Appears normal
 Choroid Plexus:        Appears normal         Stomach:                Appears normal, left
                                                                       sided
 Cerebellum:            Appears normal         Abdomen:                Appears normal
 Posterior Fossa:       Appears normal         Abdominal Wall:         Appears nml (cord
                                                                       insert, abd wall)
 Nuchal Fold:           Appears normal         Cord Vessels:           Appears normal (3
                                                                       vessel cord)
 Face:                  Appears normal         Kidneys:                Bilat pyelectasis R-
                        (orbits and profile)
                                                                       [id number]
 Lips:                  Appears normal         Bladder:                Appears normal
 Thoracic:              Appears normal         Spine:                  Not well visualized
 Heart:                 Appears normal         Upper Extremities:      Appears normal
                        (4CH, axis, and situs
 RVOT:                  Appears normal         Lower Extremities:      Appears normal
 LVOT:                  Appears normal

 Other:  Parents do not wish to know sex of fetus. Heels and 5th digit
         visualized. Lenses visualized. 3VV and 3VTV visualized. Midline falx
         visualized.
Cervix Uterus Adnexa

 Cervix
 Length:           3.94  cm.
 Normal appearance by transabdominal scan.

 Uterus
 No abnormality visualized.

 Left Ovary
 Within normal limits.

 Right Ovary
 Within normal limits.

 Adnexa
 No abnormality visualized. No adnexal mass
 visualized.
Comments

 U/S images reviewed. Findings reviewed with patient.
 Appropriate fetal growth is noted.   Mild bilateral fetal
 pyelectasis is seen on today's U/S.  No other  fetal
 abnormalities are seen.
 General counseling was then performed regarding fetal renal
 pyelectasis/hydronephrosis/hydroureter.  UPJ obstruction and
 posterior urethral valves were described and management
 discussed.  Renal pelvic diameters greater than 1 cm are
 more likely to be pathologic.  Serial U/S every 4 weeks to
 evaluate fetal renal status was recommended.  Should
 progressive dilatation occur, weekly AFI may be indicated in
 the late second or third trimester.
 Questions answered.
 10 minutes spent face to face with patient.
 Recommendations: 1) Serial U/S every 4 weeks for fetal
 growth and renal evaluation
Recommendations

  1) Serial U/S every 4 weeks for fetal growth and renal
 evaluation

              [place], Santos

## 2019-10-05 ENCOUNTER — Ambulatory Visit (INDEPENDENT_AMBULATORY_CARE_PROVIDER_SITE_OTHER): Payer: BC Managed Care – PPO | Admitting: Obstetrics and Gynecology

## 2019-10-05 ENCOUNTER — Other Ambulatory Visit: Payer: Self-pay

## 2019-10-05 ENCOUNTER — Other Ambulatory Visit (HOSPITAL_COMMUNITY)
Admission: RE | Admit: 2019-10-05 | Discharge: 2019-10-05 | Disposition: A | Discharge status: Home / Self Care | Payer: BC Managed Care – PPO | Source: Ambulatory Visit | Attending: Obstetrics and Gynecology | Admitting: Obstetrics and Gynecology

## 2019-10-05 VITALS — BP 130/84 | HR 59 | Wt 158.0 lb

## 2019-10-05 DIAGNOSIS — Z01419 Encounter for gynecological examination (general) (routine) without abnormal findings: Secondary | ICD-10-CM | POA: Diagnosis not present

## 2019-10-05 DIAGNOSIS — L659 Nonscarring hair loss, unspecified: Secondary | ICD-10-CM

## 2019-10-05 DIAGNOSIS — Z23 Encounter for immunization: Secondary | ICD-10-CM | POA: Diagnosis not present

## 2019-10-05 DIAGNOSIS — R030 Elevated blood-pressure reading, without diagnosis of hypertension: Secondary | ICD-10-CM | POA: Diagnosis not present

## 2019-10-05 NOTE — Progress Notes (Signed)
Obstetrics and Gynecology Annual Patient Evaluation  Appointment Date: 10/05/2019  OBGYN Clinic: [facility]  Primary Care Provider: Avanell Shackleton  Chief Complaint: Annual GYN  History of Present Illness: Jody Cunningham is a 34 y.o. G1P1001 (Patient's last menstrual period was 10/01/2019 (exact date).), seen for the above chief complaint. Her past medical history is significant for nothing   Patient only notes hair loss since birth of last child. She never breastfed or went on anything for birth control.   Review of Systems: Pertinent items noted in HPI and remainder of comprehensive ROS otherwise negative.    Patient Active Problem List   Diagnosis Date Noted  . History of COVID-19 03/26/2019    Past Medical History:  No past medical history on file.  Past Surgical History:  Past Surgical History:  Procedure Laterality Date  . TONSILLECTOMY    . WISDOM TOOTH EXTRACTION  2003    Past Obstetrical History:  OB History  Gravida Para Term Preterm AB Living  1 1 1     1   SAB TAB Ectopic Multiple Live Births        0 1    # Outcome Date GA Lbr Len/2nd Weight Sex Delivery Anes PTL Lv  1 Term 03/31/18 [redacted]w[redacted]d 19:10 / 05:29 8 lb 6.7 oz (3.819 kg) F Vag-Spont EPI  LIV     Birth Comments: WNL    Past Gynecological History: As per HPI. Periods: qmonth, regular, approximately 5 days She is currently using no method for contraception.   Social History:  Social History   Socioeconomic History  . Marital status: Married    Spouse name: Not on file  . Number of children: Not on file  . Years of education: Not on file  . Highest education level: Not on file  Occupational History  . Not on file  Tobacco Use  . Smoking status: Never Smoker  . Smokeless tobacco: Never Used  Substance and Sexual Activity  . Alcohol use: Not Currently    Comment: socially  . Drug use: Never  . Sexual activity: Yes    Partners: Male    Birth  control/protection: None  Other Topics Concern  . Not on file  Social History Narrative  . Not on file   Social Determinants of Health   Financial Resource Strain:   . Difficulty of Paying Living Expenses: Not on file  Food Insecurity:   . Worried About [redacted]w[redacted]d in the Last Year: Not on file  . Ran Out of Food in the Last Year: Not on file  Transportation Needs:   . Lack of Transportation (Medical): Not on file  . Lack of Transportation (Non-Medical): Not on file  Physical Activity:   . Days of Exercise per Week: Not on file  . Minutes of Exercise per Session: Not on file  Stress:   . Feeling of Stress : Not on file  Social Connections:   . Frequency of Communication with Friends and Family: Not on file  . Frequency of Social Gatherings with Friends and Family: Not on file  . Attends Religious Services: Not on file  . Active Member of Clubs or Organizations: Not on file  . Attends Programme researcher, broadcasting/film/video Meetings: Not on file  . Marital Status: Not on file  Intimate Partner Violence:   . Fear of Current or Ex-Partner: Not on file  . Emotionally Abused: Not on file  . Physically Abused: Not on file  . Sexually Abused:  Not on file    Family History:  Family History  Problem Relation Age of Onset  . Cancer Maternal Grandmother   . Thyroid disease Mother   . Hypertension Mother   . GER disease Father   . Parkinson's disease Paternal Grandfather    She denies any female cancers  Medications Ramia Sidney had no medications administered during this visit. No current outpatient medications on file.   No current facility-administered medications for this visit.    Allergies Ceclor [cefaclor]   Physical Exam:  BP 130/84   Pulse (!) 59   Wt 158 lb (71.7 kg)   LMP 10/01/2019 (Exact Date)   BMI 23.67 kg/m  Body mass index is 23.67 kg/m. General appearance: Well nourished, well developed female in no acute distress.  Neck:  Supple, normal appearance,  and no thyromegaly  Cardiovascular: normal s1 and s2.  No murmurs, rubs or gallops. Respiratory:  Clear to auscultation bilateral. Normal respiratory effort Abdomen: positive bowel sounds and no masses, hernias; diffusely non tender to palpation, non distended Breasts: breasts appear normal, no suspicious masses, no skin or nipple changes or axillary nodes, and normal palpation. Neuro/Psych:  Normal mood and affect.  Skin:  Warm and dry.  Lymphatic:  No inguinal lymphadenopathy.   Pelvic exam: is not limited by body habitus EGBUS: within normal limits Vagina: within normal limits and with no blood or discharge in the vault Cervix: normal appearing cervix without tenderness, discharge or lesions. Uterus:  nonenlarged and non tender Adnexa:  normal adnexa and no mass, fullness, tenderness Rectovaginal: deferred  Laboratory: none  Radiology: none  Assessment: pt doing well  Plan:  1. Transient hypertension Repeat BP normal and prior ones in the system were normal as well. I told her to just keep an eye on it for now  2. Hair loss Normal tsh, cbc, cmp earlier this year with PCP. No alopecia, pt just feels like she's losing more than before delivery. I told her that if it worsens to let us know and can refer to derm   3. Well woman exam with routine gynecological exam Normal exam. Defers birth control. - Cytology - PAP  Orders Placed This Encounter  Procedures  . Flu Vaccine QUAD 36+ mos IM (Fluarix, Quad PF)    RTC 1 year  Cornelia Copa MD Attending [facility])

## 2019-10-08 LAB — CYTOLOGY - PAP
Comment: NEGATIVE
Diagnosis: NEGATIVE
High risk HPV: NEGATIVE

## 2020-01-02 NOTE — L&D Delivery Note (Signed)
LABOR COURSE Patient was admitted for elective IOL. She received Cytotec and a foley balloon for cervical ripening. She was augmented with Pitocin and later SROM'd. She progressed to complete without additional intervention.  Delivery Note Called to room and patient was complete and pushing. Head delivered OA. No nuchal cord present. Shoulder and body delivered in usual fashion. At 1738 a viable female was delivered via Vaginal, Spontaneous (Presentation: OA, LOA).  Infant with spontaneous cry, placed on mother's abdomen, dried and stimulated. Cord clamped x 2 after one-minute delay, and cut by FOB Joe. Cord blood drawn. Placenta delivered spontaneously with gentle cord traction. Appears intact. Fundus firm with massage and Pitocin. Labia, perineum, vagina, and cervix inspected.    APGAR: 9, 9; weight pending .   Cord: 3VC with the following complications:None.   Cord pH: Not indicated  Anesthesia: Epidural  Episiotomy: None Lacerations: perineal abrasion, repair not indicated Est. Blood Loss (mL): 50  Mom to postpartum.  Baby to Couplet care / Skin to Skin.  Clayton Bibles, [place] 12/20/20 6:52 PM

## 2020-01-25 ENCOUNTER — Encounter: Payer: Self-pay | Admitting: Family Medicine

## 2020-01-25 ENCOUNTER — Other Ambulatory Visit: Payer: Self-pay

## 2020-01-25 ENCOUNTER — Ambulatory Visit (INDEPENDENT_AMBULATORY_CARE_PROVIDER_SITE_OTHER): Payer: BC Managed Care – PPO | Admitting: Family Medicine

## 2020-01-25 VITALS — BP 110/80 | HR 68 | Temp 98.5°F | Ht 69.0 in | Wt 152.2 lb

## 2020-01-25 DIAGNOSIS — N3000 Acute cystitis without hematuria: Secondary | ICD-10-CM | POA: Diagnosis not present

## 2020-01-25 DIAGNOSIS — R300 Dysuria: Secondary | ICD-10-CM | POA: Diagnosis not present

## 2020-01-25 LAB — POCT URINALYSIS DIP (PROADVANTAGE DEVICE)
Bilirubin, UA: NEGATIVE
Glucose, UA: NEGATIVE mg/dL
Nitrite, UA: POSITIVE — AB
Protein Ur, POC: 100 mg/dL — AB
Specific Gravity, Urine: 1.02
Urobilinogen, Ur: NEGATIVE
pH, UA: 6 (ref 5.0–8.0)

## 2020-01-25 MED ORDER — NITROFURANTOIN MONOHYD MACRO 100 MG PO CAPS: 100.0000 mg | ORAL_CAPSULE | Freq: Two times a day (BID) | ORAL | 0 refills | Status: DC

## 2020-01-25 NOTE — Patient Instructions (Signed)
Drink plenty of water. Take the antibiotics twice daily for 5 days. Contact us if your symptoms aren't completely resolving (should notice significant improvement after 2 days of antibiotics.   Urinary Tract Infection, Adult A urinary tract infection (UTI) is an infection of any part of the urinary tract. The urinary tract includes:  The kidneys.  The ureters.  The bladder.  The urethra. These organs make, store, and get rid of pee (urine) in the body. What are the causes? This infection is caused by germs (bacteria) in your genital area. These germs grow and cause swelling (inflammation) of your urinary tract. What increases the risk? The following factors may make you more likely to develop this condition:  Using a small, thin tube (catheter) to drain pee.  Not being able to control when you pee or poop (incontinence).  Being female. If you are female, these things can increase the risk: ? Using these methods to prevent pregnancy:  A medicine that kills sperm (spermicide).  A device that blocks sperm (diaphragm). ? Having low levels of a female hormone (estrogen). ? Being pregnant. You are more likely to develop this condition if:  You have genes that add to your risk.  You are sexually active.  You take antibiotic medicines.  You have trouble peeing because of: ? A prostate that is bigger than normal, if you are female. ? A blockage in the part of your body that drains pee from the bladder. ? A kidney stone. ? A nerve condition that affects your bladder. ? Not getting enough to drink. ? Not peeing often enough.  You have other conditions, such as: ? Diabetes. ? A weak disease-fighting system (immune system). ? Sickle cell disease. ? Gout. ? Injury of the spine. What are the signs or symptoms? Symptoms of this condition include:  Needing to pee right away.  Peeing small amounts often.  Pain or burning when peeing.  Blood in the pee.  Pee that smells  bad or not like normal.  Trouble peeing.  Pee that is cloudy.  Fluid coming from the vagina, if you are female.  Pain in the belly or lower back. Other symptoms include:  Vomiting.  Not feeling hungry.  Feeling mixed up (confused). This may be the first symptom in older adults.  Being tired and grouchy (irritable).  A fever.  Watery poop (diarrhea). How is this treated?  Taking antibiotic medicine.  Taking other medicines.  Drinking enough water. In some cases, you may need to see a specialist. Follow these instructions at home: Medicines  Take over-the-counter and prescription medicines only as told by your doctor.  If you were prescribed an antibiotic medicine, take it as told by your doctor. Do not stop taking it even if you start to feel better. General instructions  Make sure you: ? Pee until your bladder is empty. ? Do not hold pee for a long time. ? Empty your bladder after sex. ? Wipe from front to back after peeing or pooping if you are a female. Use each tissue one time when you wipe.  Drink enough fluid to keep your pee pale yellow.  Keep all follow-up visits.   Contact a doctor if:  You do not get better after 1-2 days.  Your symptoms go away and then come back. Get help right away if:  You have very bad back pain.  You have very bad pain in your lower belly.  You have a fever.  You have chills.  You  feeling like you will vomit or you vomit. Summary  A urinary tract infection (UTI) is an infection of any part of the urinary tract.  This condition is caused by germs in your genital area.  There are many risk factors for a UTI.  Treatment includes antibiotic medicines.  Drink enough fluid to keep your pee pale yellow. This information is not intended to replace advice given to you by your health care provider. Make sure you discuss any questions you have with your health care provider. Document Revised: 07/31/2019 Document Reviewed:  07/31/2019 Elsevier Patient Education  2021 [employer].

## 2020-01-25 NOTE — Progress Notes (Signed)
Chief Complaint  Patient presents with  . Dysuria    Burning with urination that started Saturday, cycle started Friday (typically 3 days) ending today.    2 days ago she started with dysuria, mild pelvic discomfort.  She had some intermittent urgency/frequency. Her cycle started on Friday, urinary symptoms started the following day.  Her menses last 3 days, it is "pretty much gone".    H/o previous UTIs, feels similar. None recently.   PMH, PSH, SH reviewed Married. Not on contraception, taking PNV  Outpatient Encounter Medications as of 01/25/2020  Medication Sig  . Prenatal Vit-Fe Fumarate-FA (PRENATAL VITAMINS PO) Take by mouth.   No facility-administered encounter medications on file as of 01/25/2020.   Allergies  Allergen Reactions  . Ceclor [Cefaclor] Rash   ROS: No fever, chills, nausea, vomiting, URI symptoms, headaches, dizziness, no rashes or bleeding. No abnormal vaginal discharge, odor, itch Urinary complaints per HPI  PHYSICAL EXAM:  BP 110/80   Pulse 68   Temp 98.5 F (36.9 C) (Tympanic)   Ht 5\' 9"  (1.753 m)   Wt 152 lb 3.2 oz (69 kg)   LMP 01/22/2020   Breastfeeding No   BMI 22.48 kg/m   Well-appearing, pleasant female, no distress HEENT: conjunctiva and sclera are clear, EOMI. Wearing mask Heart: regular rate and rhythm Lungs: clear Back: no CVA tenderness or spinal tenderness Abdomen: soft, nontender, no mass  Urine: cloudy, SG 1.020, large blood, nitrite +, large leuks  ASSESSMENT/PLAN:  Acute cystitis without hematuria - hydrate, take ABX as directed.  Contact 01/24/2020 in 2d if not improving. Recent cycle, not pregnant - Plan: nitrofurantoin, macrocrystal-monohydrate, (MACROBID) 100 MG capsule, Urine Culture  Burning with urination - Plan: POCT Urinalysis DIP (Proadvantage Device)

## 2020-01-28 LAB — URINE CULTURE

## 2020-01-28 MED ORDER — SULFAMETHOXAZOLE-TRIMETHOPRIM 800-160 MG PO TABS: 1.0000 | ORAL_TABLET | Freq: Two times a day (BID) | ORAL | 0 refills | Status: DC

## 2020-01-28 NOTE — Addendum Note (Signed)
Addended by: Joselyn Arrow on: 01/28/2020 04:43 PM   Modules accepted: Orders

## 2020-03-28 ENCOUNTER — Encounter: Payer: BC Managed Care – PPO | Admitting: Family Medicine

## 2020-05-25 ENCOUNTER — Ambulatory Visit (INDEPENDENT_AMBULATORY_CARE_PROVIDER_SITE_OTHER): Payer: BC Managed Care – PPO | Admitting: Advanced Practice Midwife

## 2020-05-25 ENCOUNTER — Encounter: Payer: BC Managed Care – PPO | Admitting: Advanced Practice Midwife

## 2020-05-25 ENCOUNTER — Other Ambulatory Visit: Payer: Self-pay

## 2020-05-25 ENCOUNTER — Other Ambulatory Visit (HOSPITAL_COMMUNITY)
Admission: RE | Admit: 2020-05-25 | Discharge: 2020-05-25 | Disposition: A | Discharge status: Home / Self Care | Payer: BC Managed Care – PPO | Source: Ambulatory Visit | Attending: Advanced Practice Midwife | Admitting: Advanced Practice Midwife

## 2020-05-25 ENCOUNTER — Encounter: Payer: Self-pay | Admitting: Advanced Practice Midwife

## 2020-05-25 VITALS — BP 129/76 | HR 72 | Wt 138.2 lb

## 2020-05-25 DIAGNOSIS — Z3403 Encounter for supervision of normal first pregnancy, third trimester: Secondary | ICD-10-CM | POA: Insufficient documentation

## 2020-05-25 DIAGNOSIS — Z3A10 10 weeks gestation of pregnancy: Secondary | ICD-10-CM

## 2020-05-25 DIAGNOSIS — Z3481 Encounter for supervision of other normal pregnancy, first trimester: Secondary | ICD-10-CM

## 2020-05-25 DIAGNOSIS — Z6791 Unspecified blood type, Rh negative: Secondary | ICD-10-CM

## 2020-05-25 DIAGNOSIS — O219 Vomiting of pregnancy, unspecified: Secondary | ICD-10-CM

## 2020-05-25 DIAGNOSIS — J302 Other seasonal allergic rhinitis: Secondary | ICD-10-CM

## 2020-05-25 DIAGNOSIS — O26891 Other specified pregnancy related conditions, first trimester: Secondary | ICD-10-CM

## 2020-05-25 DIAGNOSIS — Z3491 Encounter for supervision of normal pregnancy, unspecified, first trimester: Secondary | ICD-10-CM | POA: Insufficient documentation

## 2020-05-25 MED ORDER — LORATADINE 10 MG PO TABS: 10.0000 mg | ORAL_TABLET | Freq: Every day | ORAL | 0 refills | Status: DC

## 2020-05-25 MED ORDER — DOXYLAMINE-PYRIDOXINE 10-10 MG PO TBEC: 2.0000 | DELAYED_RELEASE_TABLET | Freq: Every day | ORAL | 5 refills | Status: DC

## 2020-05-25 NOTE — Patient Instructions (Signed)
https://www.cdc.gov/pregnancy/infections.html">  First Trimester of Pregnancy  The first trimester of pregnancy starts on the first day of your last menstrual period until the end of week 12. This is also called months 1 through 3 of pregnancy. Body changes during your first trimester Your body goes through many changes during pregnancy. The changes usually return to normal after your baby is born. Physical changes  You may gain or lose weight.  Your breasts may grow larger and hurt. The area around your nipples may get darker.  Dark spots or blotches may develop on your face.  You may have changes in your hair. Health changes  You may feel like you might vomit (nauseous), and you may vomit.  You may have heartburn.  You may have headaches.  You may have trouble pooping (constipation).  Your gums may bleed. Other changes  You may get tired easily.  You may pee (urinate) more often.  Your menstrual periods will stop.  You may not feel hungry.  You may want to eat certain kinds of food.  You may have changes in your emotions from day to day.  You may have more dreams. Follow these instructions at home: Medicines  Take over-the-counter and prescription medicines only as told by your doctor. Some medicines are not safe during pregnancy.  Take a prenatal vitamin that contains at least 600 micrograms (mcg) of folic acid. Eating and drinking  Eat healthy meals that include: ? Fresh fruits and vegetables. ? Whole grains. ? Good sources of protein, such as meat, eggs, or tofu. ? Low-fat dairy products.  Avoid raw meat and unpasteurized juice, milk, and cheese.  If you feel like you may vomit, or you vomit: ? Eat 4 or 5 small meals a day instead of 3 large meals. ? Try eating a few soda crackers. ? Drink liquids between meals instead of during meals.  You may need to take these actions to prevent or treat trouble pooping: ? Drink enough fluids to keep your pee  (urine) pale yellow. ? Eat foods that are high in fiber. These include beans, whole grains, and fresh fruits and vegetables. ? Limit foods that are high in fat and sugar. These include fried or sweet foods. Activity  Exercise only as told by your doctor. Most people can do their usual exercise routine during pregnancy.  Stop exercising if you have cramps or pain in your lower belly (abdomen) or low back.  Do not exercise if it is too hot or too humid, or if you are in a place of great height (high altitude).  Avoid heavy lifting.  If you choose to, you may have sex unless your doctor tells you not to. Relieving pain and discomfort  Wear a good support bra if your breasts are sore.  Rest with your legs raised (elevated) if you have leg cramps or low back pain.  If you have bulging veins (varicose veins) in your legs: ? Wear support hose as told by your doctor. ? Raise your feet for 15 minutes, 3-4 times a day. ? Limit salt in your food. Safety  Wear your seat belt at all times when you are in a car.  Talk with your doctor if someone is hurting you or yelling at you.  Talk with your doctor if you are feeling sad or have thoughts of hurting yourself. Lifestyle  Do not use hot tubs, steam rooms, or saunas.  Do not douche. Do not use tampons or scented sanitary pads.  Do not   use herbal medicines, illegal drugs, or medicines that are not approved by your doctor. Do not drink alcohol.  Do not smoke or use any products that contain nicotine or tobacco. If you need help quitting, ask your doctor.  Avoid cat litter boxes and soil that is used by cats. These carry germs that can cause harm to the baby and can cause a loss of your baby by miscarriage or stillbirth. General instructions  Keep all follow-up visits. This is important.  Ask for help if you need counseling or if you need help with nutrition. Your doctor can give you advice or tell you where to go for help.  Visit your  dentist. At home, brush your teeth with a soft toothbrush. Floss gently.  Write down your questions. Take them to your prenatal visits. Where to find more information  American Pregnancy Association: americanpregnancy.org  American College of Obstetricians and Gynecologists: www.acog.org  Office on Women's Health: womenshealth.gov/pregnancy Contact a doctor if:  You are dizzy.  You have a fever.  You have mild cramps or pressure in your lower belly.  You have a nagging pain in your belly area.  You continue to feel like you may vomit, you vomit, or you have watery poop (diarrhea) for 24 hours or longer.  You have a bad-smelling fluid coming from your vagina.  You have pain when you pee.  You are exposed to a disease that spreads from person to person, such as chickenpox, measles, Zika virus, HIV, or hepatitis. Get help right away if:  You have spotting or bleeding from your vagina.  You have very bad belly cramping or pain.  You have shortness of breath or chest pain.  You have any kind of injury, such as from a fall or a car crash.  You have new or increased pain, swelling, or redness in an arm or leg. Summary  The first trimester of pregnancy starts on the first day of your last menstrual period until the end of week 12 (months 1 through 3).  Eat 4 or 5 small meals a day instead of 3 large meals.  Do not smoke or use any products that contain nicotine or tobacco. If you need help quitting, ask your doctor.  Keep all follow-up visits. This information is not intended to replace advice given to you by your health care provider. Make sure you discuss any questions you have with your health care provider. Document Revised: 05/27/2019 Document Reviewed: 04/02/2019 Elsevier Patient Education  2021 [employer]vier Inc.  

## 2020-05-25 NOTE — Progress Notes (Signed)
History:   Tyrihanna Wingert is a 35 y.o. G2P1001 at [redacted]w[redacted]d by LMP c/w 10 week ultrasound performed today. She is being seen today for her first obstetrical visit.  Her obstetrical history is significant for nausea and vomiting in first trimester.. Patient does intend to breast feed. Pregnancy history fully reviewed.  Patient reports sinus congestion and recurrent nausea and vomiting. She states all complaints are slowly improving and she is feeling mich better than she did 2-3 days ago.   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 Loves taking care of her daughter [name]. Husband Joe works for [employer] at [employer].   HISTORY: OB History  Gravida Para Term Preterm AB Living  2 1 1  0 0 1  SAB IAB Ectopic Multiple Live Births  0 0 0 0 1    # Outcome Date GA Lbr Len/2nd Weight Sex Delivery Anes PTL Lv  2 Current           1 Term 03/31/18 [redacted]w[redacted]d 19:10 / 05:29 8 lb 6.7 oz (3.819 kg) F Vag-Spont EPI  LIV     Birth Comments: WNL     Name: Colglazier,GIRL Jaymes     Apgar1: 8  Apgar5: 8    Last pap smear was done 10/05/2019 and was normal  History reviewed. No pertinent past medical history. Past Surgical History:  Procedure Laterality Date  . TONSILLECTOMY    . WISDOM TOOTH EXTRACTION  2003   Family History  Problem Relation Age of Onset  . Cancer Maternal Grandmother   . Thyroid disease Mother   . Hypertension Mother   . GER disease Father   . Parkinson's disease Paternal Grandfather    Social History   Tobacco Use  . Smoking status: Never Smoker  . Smokeless tobacco: Never Used  Vaping Use  . Vaping Use: Never used  Substance Use Topics  . Alcohol use: Not Currently    Comment: socially  . Drug use: Never   Allergies  Allergen Reactions  . Ceclor [Cefaclor] Rash   Current Outpatient Medications on File Prior to Visit  Medication Sig Dispense Refill  . Prenatal Vit-Fe Fumarate-FA (PRENATAL VITAMINS PO) Take by mouth.     No current facility-administered medications on file  prior to visit.    Review of Systems Pertinent items noted in HPI and remainder of comprehensive ROS otherwise negative. Physical Exam:   Vitals:   05/25/20 0840  BP: 129/76  Pulse: 72  Weight: 138 lb 3.2 oz (62.7 kg)     Uterus:     Pelvic Exam: Perineum: no hemorrhoids, normal perineum   Vulva: normal external genitalia, no lesions   Vagina:  normal mucosa, normal discharge   Cervix: no lesions and normal, pap smear done.    Adnexa: normal adnexa and no mass, fullness, tenderness   Bony Pelvis: average  System: General: well-developed, well-nourished female in no acute distress   Breasts:  normal appearance, no masses or tenderness bilaterally   Skin: normal coloration and turgor, no rashes   Neurologic: oriented, normal, negative, normal mood   Extremities: normal strength, tone, and muscle mass, ROM of all joints is normal   HEENT PERRLA, extraocular movement intact and sclera clear, anicteric   Mouth/Teeth mucous membranes moist, pharynx normal without lesions and dental hygiene good   Neck supple and no masses   Cardiovascular: regular rate and rhythm   Respiratory:  no respiratory distress, normal breath sounds   Abdomen: soft, non-tender; bowel sounds normal; no masses,  no organomegaly  Bedside Ultrasound for FHR check: Patient informed that the ultrasound is considered a limited obstetric ultrasound and is not intended to be a complete ultrasound exam.  Patient also informed that the ultrasound is not being completed with the intent of assessing for fetal or placental anomalies or any pelvic abnormalities.  Explained that the purpose of today's ultrasound is to assess for fetal heart rate.  Patient acknowledges the purpose of the exam and the limitations of the study.     Assessment:    Pregnancy: G2P1001 Patient Active Problem List   Diagnosis Date Noted  . Encounter for supervision of normal pregnancy in first trimester 05/25/2020  . History of COVID-19 03/26/2019       Indications for ASA therapy (per uptodate) One of the following: Previous pregnancy with preeclampsia, especially early onset and with an adverse outcome No Multifetal gestation No Chronic hypertension No Type 1 or 2 diabetes mellitus No Chronic kidney disease No Autoimmune disease (antiphospholipid syndrome, systemic lupus erythematosus) No  Two or more of the following: Nulliparity No Obesity (body mass index >30 kg/m2) No Family history of preeclampsia in mother or sister No Age ?35 years No Sociodemographic characteristics (African American race, low socioeconomic level) No Personal risk factors (eg, previous pregnancy with low birth weight or small for gestational age infant, previous adverse pregnancy outcome [eg, stillbirth], interval >10 years between pregnancies) No  Plan:    1. Encounter for supervision of other normal pregnancy in first trimester - LOB, welcome back to practice!!! - Patient requests genetic screening be collected at next visit as she has not had a chance to discuss with her husband - CBC/D/Plt+RPR+Rh+ABO+RubIgG... - Culture, OB Urine - Genetic Screening - Enroll Patient in PreNatal Babyscripts - Cervicovaginal ancillary only  2. Nausea and vomiting during pregnancy prior to [redacted] weeks gestation - Improving  - May add Phenergan as needed in the future - Doxylamine-Pyridoxine (DICLEGIS) 10-10 MG TBEC; Take 2 tablets by mouth at bedtime. If symptoms persist, add one tablet in the morning and one in the afternoon  Dispense: 100 tablet; Refill: 5  3. Seasonal allergies - COVID negative - loratadine (CLARITIN) 10 MG tablet; Take 1 tablet (10 mg total) by mouth daily.  Dispense: 30 tablet; Refill: 0  4. [redacted] weeks gestation of pregnancy  5. Rh Neg --Rhogam at 28 weeks   Initial labs drawn. Continue prenatal vitamins. Genetic Screening discussed, First trimester screen, Quad screen and NIPS: ordered. Ultrasound discussed; fetal anatomic survey:  Discussed. Problem list reviewed and updated. The nature of [facility] with multiple MDs and other Advanced Practice Providers was explained to patient; also emphasized that residents, students are part of our team. Routine obstetric precautions reviewed. Return in about 4 weeks (around 06/22/2020) for Any Provider.     Clayton Bibles, MSN, CNM Certified Nurse Midwife, Biochemist, clinical for [employer], [facility]

## 2020-05-25 NOTE — Progress Notes (Signed)
Patient presents for Initial Prenatal visit. Patient has no concerns today. 

## 2020-05-26 LAB — CBC/D/PLT+RPR+RH+ABO+RUBIGG...
Antibody Screen: NEGATIVE
Basophils Absolute: 0 10*3/uL (ref 0.0–0.2)
Basos: 0 %
EOS (ABSOLUTE): 0 10*3/uL (ref 0.0–0.4)
Eos: 0 %
HCV Ab: 0.1 s/co ratio (ref 0.0–0.9)
HIV Screen 4th Generation wRfx: NONREACTIVE
Hematocrit: 43.6 % (ref 34.0–46.6)
Hemoglobin: 14.5 g/dL (ref 11.1–15.9)
Hepatitis B Surface Ag: NEGATIVE
Immature Grans (Abs): 0 10*3/uL (ref 0.0–0.1)
Immature Granulocytes: 0 %
Lymphocytes Absolute: 1.1 10*3/uL (ref 0.7–3.1)
Lymphs: 12 %
MCH: 30.9 pg (ref 26.6–33.0)
MCHC: 33.3 g/dL (ref 31.5–35.7)
MCV: 93 fL (ref 79–97)
Monocytes Absolute: 0.6 10*3/uL (ref 0.1–0.9)
Monocytes: 6 %
Neutrophils Absolute: 8.1 10*3/uL — ABNORMAL HIGH (ref 1.4–7.0)
Neutrophils: 82 %
Platelets: 294 10*3/uL (ref 150–450)
RBC: 4.7 x10E6/uL (ref 3.77–5.28)
RDW: 12.1 % (ref 11.7–15.4)
RPR Ser Ql: NONREACTIVE
Rh Factor: NEGATIVE
Rubella Antibodies, IGG: 3.2 index (ref >0.99)
WBC: 10 10*3/uL (ref 3.4–10.8)

## 2020-05-26 LAB — CERVICOVAGINAL ANCILLARY ONLY
Chlamydia: NEGATIVE
Comment: NEGATIVE
Comment: NEGATIVE
Comment: NORMAL
Neisseria Gonorrhea: NEGATIVE
Trichomonas: NEGATIVE

## 2020-05-26 LAB — HCV INTERPRETATION

## 2020-05-28 LAB — CULTURE, OB URINE

## 2020-05-28 LAB — URINE CULTURE, OB REFLEX

## 2020-06-22 ENCOUNTER — Ambulatory Visit (INDEPENDENT_AMBULATORY_CARE_PROVIDER_SITE_OTHER): Payer: BC Managed Care – PPO | Admitting: Family Medicine

## 2020-06-22 ENCOUNTER — Other Ambulatory Visit: Payer: Self-pay

## 2020-06-22 VITALS — BP 120/72 | HR 72 | Wt 139.0 lb

## 2020-06-22 DIAGNOSIS — Z3481 Encounter for supervision of other normal pregnancy, first trimester: Secondary | ICD-10-CM

## 2020-06-22 NOTE — Progress Notes (Signed)
   PRENATAL VISIT NOTE  Subjective:  Jody Cunningham is a 35 y.o. G2P1001 at [redacted]w[redacted]d being seen today for ongoing prenatal care.  She is currently monitored for the following issues for this low-risk pregnancy and has History of COVID-19 and Encounter for supervision of normal pregnancy in first trimester on their problem list.  Patient reports no complaints.  Contractions: Not present. Vag. Bleeding: None.   . Denies leaking of fluid.   The following portions of the patient's history were reviewed and updated as appropriate: allergies, current medications, past family history, past medical history, past social history, past surgical history and problem list.   Objective:   Vitals:   06/22/20 1609  BP: 120/72  Pulse: 72  Weight: 139 lb (63 kg)    Fetal Status: Fetal Heart Rate (bpm): 164         General:  Alert, oriented and cooperative. Patient is in no acute distress.  Skin: Skin is warm and dry. No rash noted.   Cardiovascular: Normal heart rate noted  Respiratory: Normal respiratory effort, no problems with respiration noted  Abdomen: Soft, gravid, appropriate for gestational age.  Pain/Pressure: Absent     Pelvic: Cervical exam deferred        Extremities: Normal range of motion.  Edema: None  Mental Status: Normal mood and affect. Normal behavior. Normal judgment and thought content.   Assessment and Plan:  Pregnancy: G2P1001 at [redacted]w[redacted]d 1. Encounter for supervision of other normal pregnancy in first trimester Genetics today Starting to feel better. - Genetic Screening - US MFM OB COMP + 14 WK; Future  General obstetric precautions including but not limited to vaginal bleeding, contractions, leaking of fluid and fetal movement were reviewed in detail with the patient. Please refer to After Visit Summary for other counseling recommendations.   Return in 4 weeks (on 07/20/2020).  Future Appointments  Date Time Provider Department Center  07/19/2020  8:30 AM Constant,  Gigi Gin, MD [facility]  07/26/2020  8:15 AM [facility]2 [facility]    Reva Bores, MD

## 2020-06-30 ENCOUNTER — Telehealth: Payer: Self-pay | Admitting: *Deleted

## 2020-06-30 ENCOUNTER — Encounter: Payer: Self-pay | Admitting: *Deleted

## 2020-06-30 NOTE — Telephone Encounter (Signed)
Left message for pt to call back regarding her panorama results.

## 2020-07-19 ENCOUNTER — Ambulatory Visit (INDEPENDENT_AMBULATORY_CARE_PROVIDER_SITE_OTHER): Payer: BC Managed Care – PPO | Admitting: Obstetrics and Gynecology

## 2020-07-19 ENCOUNTER — Other Ambulatory Visit: Payer: Self-pay

## 2020-07-19 ENCOUNTER — Encounter: Payer: Self-pay | Admitting: Obstetrics and Gynecology

## 2020-07-19 VITALS — BP 108/72 | HR 87 | Wt 141.8 lb

## 2020-07-19 DIAGNOSIS — Z6791 Unspecified blood type, Rh negative: Secondary | ICD-10-CM

## 2020-07-19 DIAGNOSIS — O26899 Other specified pregnancy related conditions, unspecified trimester: Secondary | ICD-10-CM

## 2020-07-19 DIAGNOSIS — Z3481 Encounter for supervision of other normal pregnancy, first trimester: Secondary | ICD-10-CM

## 2020-07-19 NOTE — Progress Notes (Signed)
   PRENATAL VISIT NOTE  Subjective:  Jody Cunningham is a 35 y.o. G2P1001 at [redacted]w[redacted]d being seen today for ongoing prenatal care.  She is currently monitored for the following issues for this low-risk pregnancy and has Rh negative, antepartum; History of COVID-19; and Encounter for supervision of normal pregnancy in first trimester on their problem list.  Patient reports no complaints.  Contractions: Not present. Vag. Bleeding: None.   . Denies leaking of fluid.   The following portions of the patient's history were reviewed and updated as appropriate: allergies, current medications, past family history, past medical history, past social history, past surgical history and problem list.   Objective:   Vitals:   07/19/20 0842  BP: 108/72  Pulse: 87  Weight: 141 lb 12.8 oz (64.3 kg)    Fetal Status: Fetal Heart Rate (bpm): 150         General:  Alert, oriented and cooperative. Patient is in no acute distress.  Skin: Skin is warm and dry. No rash noted.   Cardiovascular: Normal heart rate noted  Respiratory: Normal respiratory effort, no problems with respiration noted  Abdomen: Soft, gravid, appropriate for gestational age.  Pain/Pressure: Absent     Pelvic: Cervical exam deferred        Extremities: Normal range of motion.  Edema: None  Mental Status: Normal mood and affect. Normal behavior. Normal judgment and thought content.   Assessment and Plan:  Pregnancy: G2P1001 at [redacted]w[redacted]d 1. Encounter for supervision of other normal pregnancy in first trimester Patient is doing well without complaints AFP today Anatomy ultrasound scheduled on 7/26  2. Rh negative, antepartum Rhogam at 28 weeks  Preterm labor symptoms and general obstetric precautions including but not limited to vaginal bleeding, contractions, leaking of fluid and fetal movement were reviewed in detail with the patient. Please refer to After Visit Summary for other counseling recommendations.   Return in about 4 weeks  (around 08/16/2020) for in person, ROB, Low risk.  Future Appointments  Date Time Provider Department Center  07/26/2020  8:15 AM [facility] US2 [facility]    Catalina Antigua, MD

## 2020-07-20 LAB — AFP, SERUM, OPEN SPINA BIFIDA
AFP MoM: 0.92
AFP Value: 43.5 ng/mL
Gest. Age on Collection Date: 18 weeks
Maternal Age At EDD: 34.2 yr
OSBR Risk 1 IN: 10000
Test Results:: NEGATIVE
Weight: 141 [lb_av]

## 2020-07-26 ENCOUNTER — Other Ambulatory Visit: Payer: Self-pay | Admitting: Family Medicine

## 2020-07-26 ENCOUNTER — Ambulatory Visit: Payer: BC Managed Care – PPO | Attending: Family Medicine

## 2020-07-26 ENCOUNTER — Other Ambulatory Visit: Payer: Self-pay

## 2020-07-26 ENCOUNTER — Other Ambulatory Visit: Payer: Self-pay | Admitting: *Deleted

## 2020-07-26 DIAGNOSIS — Z3481 Encounter for supervision of other normal pregnancy, first trimester: Secondary | ICD-10-CM

## 2020-07-26 DIAGNOSIS — Z362 Encounter for other antenatal screening follow-up: Secondary | ICD-10-CM

## 2020-08-16 ENCOUNTER — Ambulatory Visit (INDEPENDENT_AMBULATORY_CARE_PROVIDER_SITE_OTHER): Payer: BC Managed Care – PPO | Admitting: Obstetrics and Gynecology

## 2020-08-16 ENCOUNTER — Other Ambulatory Visit: Payer: Self-pay

## 2020-08-16 ENCOUNTER — Encounter: Payer: BC Managed Care – PPO | Admitting: Obstetrics and Gynecology

## 2020-08-16 VITALS — BP 109/68 | HR 85 | Wt 148.0 lb

## 2020-08-16 DIAGNOSIS — Z6791 Unspecified blood type, Rh negative: Secondary | ICD-10-CM

## 2020-08-16 DIAGNOSIS — Z3480 Encounter for supervision of other normal pregnancy, unspecified trimester: Secondary | ICD-10-CM

## 2020-08-16 DIAGNOSIS — Z3A22 22 weeks gestation of pregnancy: Secondary | ICD-10-CM

## 2020-08-16 DIAGNOSIS — O26899 Other specified pregnancy related conditions, unspecified trimester: Secondary | ICD-10-CM

## 2020-08-16 NOTE — Progress Notes (Signed)
   PRENATAL VISIT NOTE  Subjective:  Jody Cunningham is a 35 y.o. G2P1001 at [redacted]w[redacted]d being seen today for ongoing prenatal care.  She is currently monitored for the following issues for this low-risk pregnancy and has Rh negative, antepartum; History of COVID-19; and Encounter for supervision of normal pregnancy in first trimester on their problem list.  Patient reports  some b/l leg soreness, no cramps .  Contractions: Not present. Vag. Bleeding: None.  Movement: Present. Denies leaking of fluid.   The following portions of the patient's history were reviewed and updated as appropriate: allergies, current medications, past family history, past medical history, past social history, past surgical history and problem list.   Objective:   Vitals:   08/16/20 1416  BP: 109/68  Pulse: 85  Weight: 148 lb (67.1 kg)    Fetal Status: Fetal Heart Rate (bpm): 156   Movement: Present     General:  Alert, oriented and cooperative. Patient is in no acute distress.  Skin: Skin is warm and dry. No rash noted.   Cardiovascular: Normal heart rate noted  Respiratory: Normal respiratory effort, no problems with respiration noted  Abdomen: Soft, gravid, appropriate for gestational age.  Pain/Pressure: Absent     Pelvic: Cervical exam deferred        Extremities: Normal range of motion.  Edema: None  Mental Status: Normal mood and affect. Normal behavior. Normal judgment and thought content.   Assessment and Plan:  Pregnancy: G2P1001 at [redacted]w[redacted]d 1. [redacted] weeks gestation of pregnancy  2.Supervision of other normal pregnancy, antepartum F/u completion anatomy u/s. Leg exam normal. Unsure etiology with leg s/s. Recommended to try TED hoses  Preterm labor symptoms and general obstetric precautions including but not limited to vaginal bleeding, contractions, leaking of fluid and fetal movement were reviewed in detail with the patient. Please refer to After Visit Summary for other counseling recommendations.    Return in about 1 month (around 09/16/2020) for 3-4wk in person, md or app, low risk ob.  Future Appointments  Date Time Provider Department Center  08/25/2020  2:30 PM [facility] NURSE [facility]  08/25/2020  2:45 PM [facility] US5 [facility]  09/06/2020  2:30 PM [place] Bing, MD [facility]  09/29/2020  8:30 AM [facility] LAB [facility]  09/29/2020 10:05 AM Anyanwu, Jethro Bastos, MD [facility]    [place] Bing, MD

## 2020-08-25 ENCOUNTER — Ambulatory Visit: Payer: BC Managed Care – PPO | Attending: Obstetrics and Gynecology

## 2020-08-25 ENCOUNTER — Ambulatory Visit: Payer: BC Managed Care – PPO | Admitting: *Deleted

## 2020-08-25 ENCOUNTER — Encounter: Payer: Self-pay | Admitting: *Deleted

## 2020-08-25 ENCOUNTER — Other Ambulatory Visit: Payer: Self-pay

## 2020-08-25 VITALS — BP 115/57 | HR 75

## 2020-08-25 DIAGNOSIS — Z362 Encounter for other antenatal screening follow-up: Secondary | ICD-10-CM

## 2020-08-25 DIAGNOSIS — O361930 Maternal care for other isoimmunization, third trimester, not applicable or unspecified: Secondary | ICD-10-CM | POA: Diagnosis not present

## 2020-08-25 DIAGNOSIS — Z3A23 23 weeks gestation of pregnancy: Secondary | ICD-10-CM | POA: Diagnosis not present

## 2020-09-06 ENCOUNTER — Other Ambulatory Visit: Payer: Self-pay

## 2020-09-06 ENCOUNTER — Telehealth (INDEPENDENT_AMBULATORY_CARE_PROVIDER_SITE_OTHER): Payer: BC Managed Care – PPO | Admitting: Obstetrics and Gynecology

## 2020-09-06 DIAGNOSIS — Z3A25 25 weeks gestation of pregnancy: Secondary | ICD-10-CM

## 2020-09-06 DIAGNOSIS — Z6791 Unspecified blood type, Rh negative: Secondary | ICD-10-CM

## 2020-09-06 DIAGNOSIS — O360120 Maternal care for anti-D [Rh] antibodies, second trimester, not applicable or unspecified: Secondary | ICD-10-CM

## 2020-09-06 DIAGNOSIS — Z3401 Encounter for supervision of normal first pregnancy, first trimester: Secondary | ICD-10-CM

## 2020-09-06 DIAGNOSIS — O26899 Other specified pregnancy related conditions, unspecified trimester: Secondary | ICD-10-CM

## 2020-09-11 ENCOUNTER — Encounter: Payer: Self-pay | Admitting: Obstetrics and Gynecology

## 2020-09-11 NOTE — Progress Notes (Signed)
    TELEHEALTH OBSTETRICS VISIT ENCOUNTER NOTE  Provider location: Center for [facility]   Patient location: Home  I connected with Jody Cunningham on 09/11/20 at  2:30 PM EDT by telephone at home and verified that I am speaking with the correct person using two identifiers. Of note, unable to do video encounter due to technical difficulties.    I discussed the limitations, risks, security and privacy concerns of performing an evaluation and management service by telephone and the availability of in person appointments. I also discussed with the patient that there may be a patient responsible charge related to this service. The patient expressed understanding and agreed to proceed.  Subjective:  Jody Cunningham is a 35 y.o. G2P1001 at [redacted]w[redacted]d being followed for ongoing prenatal care.  She is currently monitored for the following issues for this low-risk pregnancy and has Rh negative, antepartum and Encounter for supervision of normal pregnancy in first trimester on their problem list.  Patient reports no complaints. Reports fetal movement. Denies any contractions, bleeding or leaking of fluid.   The following portions of the patient's history were reviewed and updated as appropriate: allergies, current medications, past family history, past medical history, past social history, past surgical history and problem list.   Objective:  Last menstrual period 03/15/2020. General:  Alert, oriented and cooperative.   Mental Status: Normal mood and affect perceived. Normal judgment and thought content.  Rest of physical exam deferred due to type of encounter  Assessment and Plan:  Pregnancy: G2P1001 at [redacted]w[redacted]d 1. Encounter for supervision of normal first pregnancy in first trimester Routine care.  2. Rh negative, antepartum Rhogam and ab screen 28wks  3. [redacted] weeks gestation of pregnancy  Preterm labor symptoms and general obstetric precautions including but not  limited to vaginal bleeding, contractions, leaking of fluid and fetal movement were reviewed in detail with the patient.  I discussed the assessment and treatment plan with the patient. The patient was provided an opportunity to ask questions and all were answered. The patient agreed with the plan and demonstrated an understanding of the instructions. The patient was advised to call back or seek an in-person office evaluation/go to [facility] at [facility] for any urgent or concerning symptoms. Please refer to After Visit Summary for other counseling recommendations.   I provided 7 minutes of non-face-to-face time during this encounter.  No follow-ups on file.  Future Appointments  Date Time Provider Department Center  09/29/2020  8:30 AM [facility] LAB [facility]  09/29/2020 10:05 AM Anyanwu, Jethro Bastos, MD [facility]    [place] Bing, MD Center for [facility], [facility]

## 2020-09-29 ENCOUNTER — Other Ambulatory Visit: Payer: BC Managed Care – PPO

## 2020-09-29 ENCOUNTER — Ambulatory Visit (INDEPENDENT_AMBULATORY_CARE_PROVIDER_SITE_OTHER): Payer: BC Managed Care – PPO | Admitting: Obstetrics & Gynecology

## 2020-09-29 ENCOUNTER — Other Ambulatory Visit: Payer: Self-pay

## 2020-09-29 VITALS — BP 122/74 | HR 79 | Wt 162.0 lb

## 2020-09-29 DIAGNOSIS — Z6791 Unspecified blood type, Rh negative: Secondary | ICD-10-CM

## 2020-09-29 DIAGNOSIS — O26893 Other specified pregnancy related conditions, third trimester: Secondary | ICD-10-CM | POA: Diagnosis not present

## 2020-09-29 DIAGNOSIS — Z3A28 28 weeks gestation of pregnancy: Secondary | ICD-10-CM

## 2020-09-29 DIAGNOSIS — Z3401 Encounter for supervision of normal first pregnancy, first trimester: Secondary | ICD-10-CM

## 2020-09-29 DIAGNOSIS — O26899 Other specified pregnancy related conditions, unspecified trimester: Secondary | ICD-10-CM

## 2020-09-29 DIAGNOSIS — Z3403 Encounter for supervision of normal first pregnancy, third trimester: Secondary | ICD-10-CM

## 2020-09-29 MED ORDER — RHO D IMMUNE GLOBULIN 1500 UNIT/2ML IJ SOSY
300.0000 ug | PREFILLED_SYRINGE | Freq: Once | INTRAMUSCULAR | Status: AC
  Administered 2020-09-29: 300 ug via INTRAMUSCULAR

## 2020-09-29 NOTE — Progress Notes (Signed)
   PRENATAL VISIT NOTE  Subjective:  Jody Cunningham is a 35 y.o. G2P1001 at [redacted]w[redacted]d being seen today for ongoing prenatal care.  She is currently monitored for the following issues for this low-risk pregnancy and has Rh negative, antepartum and Encounter for supervision of normal first pregnancy in third trimester on their problem list.  Patient reports no complaints.  Contractions: Not present. Vag. Bleeding: None.  Movement: Present. Denies leaking of fluid.   The following portions of the patient's history were reviewed and updated as appropriate: allergies, current medications, past family history, past medical history, past social history, past surgical history and problem list.   Objective:   Vitals:   09/29/20 0840  BP: 122/74  Pulse: 79  Weight: 162 lb (73.5 kg)    Fetal Status: Fetal Heart Rate (bpm): 135 Fundal Height: 28 cm Movement: Present     General:  Alert, oriented and cooperative. Patient is in no acute distress.  Skin: Skin is warm and dry. No rash noted.   Cardiovascular: Normal heart rate noted  Respiratory: Normal respiratory effort, no problems with respiration noted  Abdomen: Soft, gravid, appropriate for gestational age.  Pain/Pressure: Absent     Pelvic: Cervical exam deferred        Extremities: Normal range of motion.  Edema: None  Mental Status: Normal mood and affect. Normal behavior. Normal judgment and thought content.   Assessment and Plan:  Pregnancy: G2P1001 at [redacted]w[redacted]d 1. Rh negative, antepartum Rhogam given. - rho (d) immune globulin (RHIG/RHOPHYLAC) injection 300 mcg  2. [redacted] weeks gestation of pregnancy 3. Encounter for supervision of normal first pregnancy in third trimester Third trimester labs done, will follow up results and manage accordingly. Vaccine deferred until next visit. Preterm labor symptoms and general obstetric precautions including but not limited to vaginal bleeding, contractions, leaking of fluid and fetal movement were  reviewed in detail with the patient. Please refer to After Visit Summary for other counseling recommendations.   No follow-ups on file.  Future Appointments  Date Time Provider Department Center  09/29/2020 10:05 AM Syre Knerr, Jethro Bastos, MD [facility]  10/12/2020  2:30 PM Calvert Cantor, CNM [facility]    [name], MD

## 2020-09-30 LAB — CBC
Hematocrit: 32.9 % — ABNORMAL LOW (ref 34.0–46.6)
Hemoglobin: 11.3 g/dL (ref 11.1–15.9)
MCH: 32.3 pg (ref 26.6–33.0)
MCHC: 34.3 g/dL (ref 31.5–35.7)
MCV: 94 fL (ref 79–97)
Platelets: 328 10*3/uL (ref 150–450)
RBC: 3.5 x10E6/uL — ABNORMAL LOW (ref 3.77–5.28)
RDW: 12.4 % (ref 11.7–15.4)
WBC: 14.2 10*3/uL — ABNORMAL HIGH (ref 3.4–10.8)

## 2020-09-30 LAB — GLUCOSE TOLERANCE, 2 HOURS W/ 1HR
Glucose, 1 hour: 90 mg/dL (ref 65–179)
Glucose, 2 hour: 90 mg/dL (ref 65–152)
Glucose, Fasting: 80 mg/dL (ref 65–91)

## 2020-09-30 LAB — RPR: RPR Ser Ql: NONREACTIVE

## 2020-09-30 LAB — HIV ANTIBODY (ROUTINE TESTING W REFLEX): HIV Screen 4th Generation wRfx: NONREACTIVE

## 2020-09-30 LAB — ANTIBODY SCREEN: Antibody Screen: NEGATIVE

## 2020-10-11 ENCOUNTER — Other Ambulatory Visit: Payer: Self-pay

## 2020-10-11 ENCOUNTER — Ambulatory Visit (INDEPENDENT_AMBULATORY_CARE_PROVIDER_SITE_OTHER): Payer: BC Managed Care – PPO | Admitting: Family Medicine

## 2020-10-11 VITALS — BP 117/75 | HR 98 | Wt 165.0 lb

## 2020-10-11 DIAGNOSIS — Z23 Encounter for immunization: Secondary | ICD-10-CM

## 2020-10-11 DIAGNOSIS — Z3403 Encounter for supervision of normal first pregnancy, third trimester: Secondary | ICD-10-CM

## 2020-10-11 DIAGNOSIS — Z6791 Unspecified blood type, Rh negative: Secondary | ICD-10-CM

## 2020-10-11 DIAGNOSIS — O26899 Other specified pregnancy related conditions, unspecified trimester: Secondary | ICD-10-CM

## 2020-10-11 NOTE — Progress Notes (Signed)
   PRENATAL VISIT NOTE  Subjective:  Jody Cunningham is a 35 y.o. G2P1001 at [redacted]w[redacted]d being seen today for ongoing prenatal care.  She is currently monitored for the following issues for this low-risk pregnancy and has Rh negative, antepartum and Encounter for supervision of normal first pregnancy in third trimester on their problem list.  Patient reports no complaints.  Contractions: Not present. Vag. Bleeding: None.  Movement: Present. Denies leaking of fluid.   The following portions of the patient's history were reviewed and updated as appropriate: allergies, current medications, past family history, past medical history, past social history, past surgical history and problem list.   Objective:   Vitals:   10/11/20 1438  BP: 117/75  Pulse: 98  Weight: 165 lb (74.8 kg)    Fetal Status: Fetal Heart Rate (bpm): 148 Fundal Height: 31 cm Movement: Present     General:  Alert, oriented and cooperative. Patient is in no acute distress.  Skin: Skin is warm and dry. No rash noted.   Cardiovascular: Normal heart rate noted  Respiratory: Normal respiratory effort, no problems with respiration noted  Abdomen: Soft, gravid, appropriate for gestational age.  Pain/Pressure: Absent     Pelvic: Cervical exam deferred        Extremities: Normal range of motion.  Edema: None  Mental Status: Normal mood and affect. Normal behavior. Normal judgment and thought content.   Assessment and Plan:  Pregnancy: G2P1001 at [redacted]w[redacted]d 1. Encounter for supervision of normal first pregnancy in third trimester Continue routine prenatal care.  2. Rh negative, antepartum S/p rhogam  3. Flu vaccine need - Flu Vaccine QUAD 51mo+IM (Fluarix, Fluzone & Alfiuria Quad PF)  4. Need for Tdap vaccination - Tdap vaccine greater than or equal to 7yo IM  Preterm labor symptoms and general obstetric precautions including but not limited to vaginal bleeding, contractions, leaking of fluid and fetal movement were reviewed in  detail with the patient. Please refer to After Visit Summary for other counseling recommendations.   Return in 2 weeks (on 10/25/2020).  Future Appointments  Date Time Provider Department Center  10/25/2020  2:30 PM Constant, Gigi Gin, MD [facility]  11/08/2020  2:30 PM Anyanwu, Jethro Bastos, MD [facility]    Reva Bores, MD

## 2020-10-11 NOTE — Patient Instructions (Signed)

## 2020-10-11 NOTE — Progress Notes (Signed)
ROB 30w  Flu and Tdap offered: Desires.  Rhogam: 09/29/20  CC: None

## 2020-10-12 ENCOUNTER — Encounter: Payer: BC Managed Care – PPO | Admitting: Advanced Practice Midwife

## 2020-10-25 ENCOUNTER — Ambulatory Visit (INDEPENDENT_AMBULATORY_CARE_PROVIDER_SITE_OTHER): Payer: BC Managed Care – PPO | Admitting: Obstetrics and Gynecology

## 2020-10-25 ENCOUNTER — Other Ambulatory Visit: Payer: Self-pay

## 2020-10-25 ENCOUNTER — Encounter: Payer: Self-pay | Admitting: Obstetrics and Gynecology

## 2020-10-25 VITALS — BP 114/73 | HR 93 | Wt 166.0 lb

## 2020-10-25 DIAGNOSIS — Z6791 Unspecified blood type, Rh negative: Secondary | ICD-10-CM

## 2020-10-25 DIAGNOSIS — Z3403 Encounter for supervision of normal first pregnancy, third trimester: Secondary | ICD-10-CM

## 2020-10-25 DIAGNOSIS — O26899 Other specified pregnancy related conditions, unspecified trimester: Secondary | ICD-10-CM

## 2020-10-25 NOTE — Progress Notes (Signed)
   PRENATAL VISIT NOTE  Subjective:  Jody Cunningham is a 35 y.o. G2P1001 at [redacted]w[redacted]d being seen today for ongoing prenatal care.  She is currently monitored for the following issues for this low-risk pregnancy and has Rh negative, antepartum and Encounter for supervision of normal first pregnancy in third trimester on their problem list.  Patient reports no complaints.  Contractions: Not present. Vag. Bleeding: None.  Movement: Present. Denies leaking of fluid.   The following portions of the patient's history were reviewed and updated as appropriate: allergies, current medications, past family history, past medical history, past social history, past surgical history and problem list.   Objective:   Vitals:   10/25/20 1431  BP: 114/73  Pulse: 93  Weight: 166 lb (75.3 kg)    Fetal Status: Fetal Heart Rate (bpm): 161   Movement: Present     General:  Alert, oriented and cooperative. Patient is in no acute distress.  Skin: Skin is warm and dry. No rash noted.   Cardiovascular: Normal heart rate noted  Respiratory: Normal respiratory effort, no problems with respiration noted  Abdomen: Soft, gravid, appropriate for gestational age.  Pain/Pressure: Absent     Pelvic: Cervical exam deferred        Extremities: Normal range of motion.  Edema: None  Mental Status: Normal mood and affect. Normal behavior. Normal judgment and thought content.   Assessment and Plan:  Pregnancy: G2P1001 at [redacted]w[redacted]d 1. Encounter for supervision of normal first pregnancy in third trimester Patient is doing well without complaints  2. Rh negative, antepartum S/p rhogam  Preterm labor symptoms and general obstetric precautions including but not limited to vaginal bleeding, contractions, leaking of fluid and fetal movement were reviewed in detail with the patient. Please refer to After Visit Summary for other counseling recommendations.   Return in about 2 weeks (around 11/08/2020) for in person, ROB, Low  risk.  Future Appointments  Date Time Provider Department Center  11/08/2020  2:30 PM Anyanwu, Jethro Bastos, MD [facility]    Catalina Antigua, MD

## 2020-10-25 NOTE — Progress Notes (Signed)
ROB 32w  CC:None    

## 2020-11-08 ENCOUNTER — Ambulatory Visit (INDEPENDENT_AMBULATORY_CARE_PROVIDER_SITE_OTHER): Payer: BC Managed Care – PPO | Admitting: Obstetrics & Gynecology

## 2020-11-08 ENCOUNTER — Encounter: Payer: Self-pay | Admitting: Obstetrics & Gynecology

## 2020-11-08 ENCOUNTER — Other Ambulatory Visit: Payer: Self-pay

## 2020-11-08 VITALS — BP 116/76 | HR 92 | Wt 169.0 lb

## 2020-11-08 DIAGNOSIS — Z3A34 34 weeks gestation of pregnancy: Secondary | ICD-10-CM

## 2020-11-08 DIAGNOSIS — Z3403 Encounter for supervision of normal first pregnancy, third trimester: Secondary | ICD-10-CM

## 2020-11-08 NOTE — Progress Notes (Signed)
   PRENATAL VISIT NOTE  Subjective:  Jody Cunningham is a 35 y.o. G2P1001 at [redacted]w[redacted]d being seen today for ongoing prenatal care.  She is currently monitored for the following issues for this low-risk pregnancy and has Rh negative, antepartum and Encounter for supervision of normal first pregnancy in third trimester on their problem list.  Patient reports no complaints.  Contractions: Not present. Vag. Bleeding: None.  Movement: Present. Denies leaking of fluid.   The following portions of the patient's history were reviewed and updated as appropriate: allergies, current medications, past family history, past medical history, past social history, past surgical history and problem list.   Objective:   Vitals:   11/08/20 1440  BP: 116/76  Pulse: 92  Weight: 169 lb (76.7 kg)    Fetal Status: Fetal Heart Rate (bpm): 154 Fundal Height: 35 cm Movement: Present     General:  Alert, oriented and cooperative. Patient is in no acute distress.  Skin: Skin is warm and dry. No rash noted.   Cardiovascular: Normal heart rate noted  Respiratory: Normal respiratory effort, no problems with respiration noted  Abdomen: Soft, gravid, appropriate for gestational age.  Pain/Pressure: Present     Pelvic: Cervical exam deferred        Extremities: Normal range of motion.  Edema: None  Mental Status: Normal mood and affect. Normal behavior. Normal judgment and thought content.   Assessment and Plan:  Pregnancy: G2P1001 at [redacted]w[redacted]d 1. [redacted] weeks gestation of pregnancy 2. Encounter for supervision of normal first pregnancy in third trimester Doing well, no concerns.  Pelvic cultures next visit.  Preterm labor symptoms and general obstetric precautions including but not limited to vaginal bleeding, contractions, leaking of fluid and fetal movement were reviewed in detail with the patient. Please refer to After Visit Summary for other counseling recommendations.   No follow-ups on file.  Future Appointments   Date Time Provider Department Center  11/15/2020  2:30 PM Tereso Newcomer, MD [facility]  11/22/2020  2:50 PM [place] Bing, MD [facility]  11/29/2020  2:30 PM Tishie Altmann, Jethro Bastos, MD [facility]    [name], MD

## 2020-11-15 ENCOUNTER — Other Ambulatory Visit: Payer: Self-pay

## 2020-11-15 ENCOUNTER — Encounter: Payer: Self-pay | Admitting: Obstetrics & Gynecology

## 2020-11-15 ENCOUNTER — Ambulatory Visit (INDEPENDENT_AMBULATORY_CARE_PROVIDER_SITE_OTHER): Payer: BC Managed Care – PPO | Admitting: Obstetrics & Gynecology

## 2020-11-15 ENCOUNTER — Other Ambulatory Visit (HOSPITAL_COMMUNITY)
Admission: RE | Admit: 2020-11-15 | Discharge: 2020-11-15 | Disposition: A | Discharge status: Home / Self Care | Payer: BC Managed Care – PPO | Source: Ambulatory Visit | Attending: Obstetrics & Gynecology | Admitting: Obstetrics & Gynecology

## 2020-11-15 VITALS — BP 115/75 | HR 77 | Wt 170.0 lb

## 2020-11-15 DIAGNOSIS — Z3A35 35 weeks gestation of pregnancy: Secondary | ICD-10-CM

## 2020-11-15 DIAGNOSIS — Z3400 Encounter for supervision of normal first pregnancy, unspecified trimester: Secondary | ICD-10-CM | POA: Insufficient documentation

## 2020-11-15 DIAGNOSIS — Z3403 Encounter for supervision of normal first pregnancy, third trimester: Secondary | ICD-10-CM

## 2020-11-15 NOTE — Patient Instructions (Signed)
Return to office for any scheduled appointments. Call the office or go to the MAU at [facility] [place] if:  You begin to have strong, frequent contractions  Your water breaks.  Sometimes it is a big gush of fluid, sometimes it is just a trickle that keeps getting your panties wet or running down your legs  You have vaginal bleeding.  It is normal to have a small amount of spotting if your cervix was checked.   You do not feel your baby moving like normal.  If you do not, get something to eat and drink and lay down and focus on feeling your baby move.   If your baby is still not moving like normal, you should call the office or go to MAU.  Any other obstetric concerns.   

## 2020-11-15 NOTE — Progress Notes (Signed)
   PRENATAL VISIT NOTE  Subjective:  Jody Cunningham is a 35 y.o. G2P1001 at [redacted]w[redacted]d being seen today for ongoing prenatal care.  She is currently monitored for the following issues for this low-risk pregnancy and has Rh negative, antepartum and Encounter for supervision of normal first pregnancy in third trimester on their problem list.  Patient reports no complaints.  Contractions: Not present. Vag. Bleeding: None.  Movement: Present. Denies leaking of fluid.   The following portions of the patient's history were reviewed and updated as appropriate: allergies, current medications, past family history, past medical history, past social history, past surgical history and problem list.   Objective:   Vitals:   11/15/20 1432  BP: 115/75  Pulse: 77  Weight: 170 lb (77.1 kg)    Fetal Status: Fetal Heart Rate (bpm): 157 Fundal Height: 37 cm Movement: Present  Presentation: Vertex  General:  Alert, oriented and cooperative. Patient is in no acute distress.  Skin: Skin is warm and dry. No rash noted.   Cardiovascular: Normal heart rate noted  Respiratory: Normal respiratory effort, no problems with respiration noted  Abdomen: Soft, gravid, appropriate for gestational age.  Pain/Pressure: Present     Pelvic: Cervical exam performed in the presence of a chaperone Dilation: Closed Effacement (%): Thick Station: Ballotable  Extremities: Normal range of motion.  Edema: None  Mental Status: Normal mood and affect. Normal behavior. Normal judgment and thought content.   Assessment and Plan:  Pregnancy: G2P1001 at [redacted]w[redacted]d 1. Encounter for supervision of normal first pregnancy in third trimester 2. [redacted] weeks gestation of pregnancy Pelvic cultures done today, will follow up results and manage accordingly. - Strep Gp B Culture+Rflx - GC/Chlamydia probe amp ([place])not at [facility] Preterm labor symptoms and general obstetric precautions including but not limited to vaginal bleeding, contractions,  leaking of fluid and fetal movement were reviewed in detail with the patient. Please refer to After Visit Summary for other counseling recommendations.   Return in about 1 week (around 11/22/2020) for OB visits as scheduled.  Future Appointments  Date Time Provider Department Center  11/22/2020  2:50 PM [place] Bing, MD [facility]  11/29/2020  2:30 PM Krishna Dancel, Jethro Bastos, MD [facility]    [name], MD

## 2020-11-17 LAB — GC/CHLAMYDIA PROBE AMP (~~LOC~~) NOT AT ARMC
Chlamydia: NEGATIVE
Comment: NEGATIVE
Comment: NORMAL
Neisseria Gonorrhea: NEGATIVE

## 2020-11-19 LAB — STREP GP B CULTURE+RFLX: Strep Gp B Culture+Rflx: NEGATIVE

## 2020-11-22 ENCOUNTER — Other Ambulatory Visit: Payer: Self-pay

## 2020-11-22 ENCOUNTER — Ambulatory Visit (INDEPENDENT_AMBULATORY_CARE_PROVIDER_SITE_OTHER): Payer: BC Managed Care – PPO | Admitting: Obstetrics and Gynecology

## 2020-11-22 VITALS — BP 122/77 | HR 80 | Wt 171.0 lb

## 2020-11-22 DIAGNOSIS — Z3A36 36 weeks gestation of pregnancy: Secondary | ICD-10-CM

## 2020-11-22 NOTE — Progress Notes (Signed)
   PRENATAL VISIT NOTE  Subjective:  Jody Cunningham is a 35 y.o. G2P1001 at [redacted]w[redacted]d being seen today for ongoing prenatal care.  She is currently monitored for the following issues for this low-risk pregnancy and has Rh negative, antepartum and Encounter for supervision of normal first pregnancy in third trimester on their problem list.  Patient reports no complaints.  Contractions: Not present. Vag. Bleeding: None.  Movement: Present. Denies leaking of fluid.   The following portions of the patient's history were reviewed and updated as appropriate: allergies, current medications, past family history, past medical history, past social history, past surgical history and problem list.   Objective:   Vitals:   11/22/20 1452  BP: 122/77  Pulse: 80  Weight: 171 lb (77.6 kg)    Fetal Status: Fetal Heart Rate (bpm): 165 Fundal Height: 36 cm Movement: Present  Presentation: Vertex  General:  Alert, oriented and cooperative. Patient is in no acute distress.  Skin: Skin is warm and dry. No rash noted.   Cardiovascular: Normal heart rate noted  Respiratory: Normal respiratory effort, no problems with respiration noted  Abdomen: Soft, gravid, appropriate for gestational age.  Pain/Pressure: Present     Pelvic: Cervical exam deferred        Extremities: Normal range of motion.  Edema: None  Mental Status: Normal mood and affect. Normal behavior. Normal judgment and thought content.   Assessment and Plan:  Pregnancy: G2P1001 at [redacted]w[redacted]d 1. [redacted] weeks gestation of pregnancy GBS neg last week.  Routine care  Preterm labor symptoms and general obstetric precautions including but not limited to vaginal bleeding, contractions, leaking of fluid and fetal movement were reviewed in detail with the patient. Please refer to After Visit Summary for other counseling recommendations.   No follow-ups on file.  Future Appointments  Date Time Provider Department Center  11/29/2020  2:30 PM Anyanwu, Jethro Bastos, MD [facility]  12/06/2020  3:10 PM Anyanwu, Jethro Bastos, MD [facility]     Bing, MD

## 2020-11-29 ENCOUNTER — Encounter: Payer: Self-pay | Admitting: Obstetrics & Gynecology

## 2020-11-29 ENCOUNTER — Other Ambulatory Visit: Payer: Self-pay

## 2020-11-29 ENCOUNTER — Ambulatory Visit (INDEPENDENT_AMBULATORY_CARE_PROVIDER_SITE_OTHER): Payer: BC Managed Care – PPO | Admitting: Obstetrics & Gynecology

## 2020-11-29 VITALS — BP 119/70 | HR 92 | Wt 174.0 lb

## 2020-11-29 DIAGNOSIS — Z3403 Encounter for supervision of normal first pregnancy, third trimester: Secondary | ICD-10-CM

## 2020-11-29 DIAGNOSIS — Z3A37 37 weeks gestation of pregnancy: Secondary | ICD-10-CM

## 2020-11-29 NOTE — Patient Instructions (Addendum)
Return to office for any scheduled appointments. Call the office or go to the MAU at [facility] [place] if:  You begin to have strong, frequent contractions  Your water breaks.  Sometimes it is a big gush of fluid, sometimes it is just a trickle that keeps getting your panties wet or running down your legs  You have vaginal bleeding.  It is normal to have a small amount of spotting if your cervix was checked.   You do not feel your baby moving like normal.  If you do not, get something to eat and drink and lay down and focus on feeling your baby move.   If your baby is still not moving like normal, you should call the office or go to MAU.  Any other obstetric concerns.  OUTPATIENT FOLEY BULB INDUCTION OF LABOR:  Information Sheet for Mothers and Family               What's a Foley Bulb Induction? A Foley bulb induction is a procedure where your provider inserts a catheter into your cervix. Once inside your womb, your provider inflates the balloon with a saline solution.   This puts pressure on your cervix and encourages dilation. The catheter falls out once your cervix dilates to 3-4 centimeters.     With any procedure, it's important that you know what to expect. The insertion of a Foley catheter can be a bit uncomfortable, and some women experience sharp pelvic pain. The pain may subside once the catheter is in place. You may experience some cramping when the Foley catheter is in place.  This is normal.     GO TO THE MATERNITY ADMISSIONS UNIT FOR THE FOLLOWING:  Heavy vaginal bleeding  Rupture of membranes (fluid that wets your underwear)  Painful uterine contractions every 5 minutes or less  Severe abdominal discomfort  Decreased movement of the baby     

## 2020-11-29 NOTE — Progress Notes (Signed)
   PRENATAL VISIT NOTE  Subjective:  Jody Cunningham is a 35 y.o. G2P1001 at [redacted]w[redacted]d being seen today for ongoing prenatal care.  She is currently monitored for the following issues for this low-risk pregnancy and has Rh negative, antepartum and Encounter for supervision of normal first pregnancy in third trimester on their problem list.  Patient reports no complaints.  Contractions: Not present. Vag. Bleeding: None.  Movement: Present. Denies leaking of fluid.   The following portions of the patient's history were reviewed and updated as appropriate: allergies, current medications, past family history, past medical history, past social history, past surgical history and problem list.   Objective:   Vitals:   11/29/20 1440  BP: 119/70  Pulse: 92  Weight: 174 lb (78.9 kg)    Fetal Status: Fetal Heart Rate (bpm): 144 Fundal Height: 38 cm Movement: Present     General:  Alert, oriented and cooperative. Patient is in no acute distress.  Skin: Skin is warm and dry. No rash noted.   Cardiovascular: Normal heart rate noted  Respiratory: Normal respiratory effort, no problems with respiration noted  Abdomen: Soft, gravid, appropriate for gestational age.  Pain/Pressure: Present     Pelvic: Cervical exam deferred        Extremities: Normal range of motion.  Edema: None  Mental Status: Normal mood and affect. Normal behavior. Normal judgment and thought content.   Assessment and Plan:  Pregnancy: G2P1001 at [redacted]w[redacted]d 1. [redacted] weeks gestation of pregnancy 2. Encounter for supervision of normal first pregnancy in third trimester Negative pelvic cultures reviewed with patient.   Patient desires to be scheduled for IOl at 40 weeks. Discussed IOL at 40 weeks, she was told she can go up to 41 weeks to maximize chances of spontaneous labor, also talked about need for post term BPP.  She still desired IOL at 40 weeks.  Risks and benefits of induction were reviewed, including failure of method, prolonged  labor, need for further intervention, risk of cesarean section.  Patient understand these risks and wish to proceed.  Options of misoprostol foley bulb, artificial rupture of membranes, and pitocin reviewed, with use of each discussed in detail. All questions answered.  Induction of labor scheduled on 12/20/20 at midnight, orders have been signed and held. Offered outpatient foley placement for cervical ripening, she will consider this, information was given to her to review at home. She was told to expect a call from [facility] with further instructions about her pre-admission COVID screening and any further instructions about the induction of labor.  Term labor symptoms and general obstetric precautions including but not limited to vaginal bleeding, contractions, leaking of fluid and fetal movement were reviewed in detail with the patient. Please refer to After Visit Summary for other counseling recommendations.   Return in about 1 week (around 12/06/2020) for OFFICE OB VISIT (MD or APP).  Future Appointments  Date Time Provider Department Center  12/06/2020  3:10 PM L[place] Matzen, Jethro Bastos, MD [facility]  12/20/2020 12:00 AM [facility] SCHED ROOM [facility] None    [name], MD

## 2020-12-06 ENCOUNTER — Other Ambulatory Visit: Payer: Self-pay

## 2020-12-06 ENCOUNTER — Ambulatory Visit (INDEPENDENT_AMBULATORY_CARE_PROVIDER_SITE_OTHER): Payer: BC Managed Care – PPO | Admitting: Obstetrics & Gynecology

## 2020-12-06 ENCOUNTER — Encounter: Payer: Self-pay | Admitting: Obstetrics & Gynecology

## 2020-12-06 VITALS — BP 125/73 | HR 80 | Wt 172.0 lb

## 2020-12-06 DIAGNOSIS — Z3A38 38 weeks gestation of pregnancy: Secondary | ICD-10-CM

## 2020-12-06 DIAGNOSIS — Z3403 Encounter for supervision of normal first pregnancy, third trimester: Secondary | ICD-10-CM

## 2020-12-06 NOTE — Progress Notes (Signed)
   PRENATAL VISIT NOTE  Subjective:  Jody Cunningham is a 35 y.o. G2P1001 at [redacted]w[redacted]d being seen today for ongoing prenatal care.  She is currently monitored for the following issues for this low-risk pregnancy and has Rh negative, antepartum and Encounter for supervision of normal first pregnancy in third trimester on their problem list.  Patient reports no complaints.  Contractions: Not present. Vag. Bleeding: None.  Movement: Present. Denies leaking of fluid.   The following portions of the patient's history were reviewed and updated as appropriate: allergies, current medications, past family history, past medical history, past social history, past surgical history and problem list.   Objective:   Vitals:   12/06/20 1518  BP: 125/73  Pulse: 80  Weight: 172 lb (78 kg)    Fetal Status: Fetal Heart Rate (bpm): 138 Fundal Height: 39 cm Movement: Present  Presentation: Vertex  General:  Alert, oriented and cooperative. Patient is in no acute distress.  Skin: Skin is warm and dry. No rash noted.   Cardiovascular: Normal heart rate noted  Respiratory: Normal respiratory effort, no problems with respiration noted  Abdomen: Soft, gravid, appropriate for gestational age.  Pain/Pressure: Present     Pelvic: Cervical exam performed in the presence of a chaperone Dilation: 1 Effacement (%): Thick Station: Ballotable  Extremities: Normal range of motion.  Edema: None  Mental Status: Normal mood and affect. Normal behavior. Normal judgment and thought content.   Assessment and Plan:  Pregnancy: G2P1001 at [redacted]w[redacted]d 1. [redacted] weeks gestation of pregnancy 2. Encounter for supervision of normal first pregnancy in third trimester Already scheduled for elective IOL at 40 weeks. Labor symptoms and general obstetric precautions including but not limited to vaginal bleeding, contractions, leaking of fluid and fetal movement were reviewed in detail with the patient. Please refer to After Visit Summary for  other counseling recommendations.   Return in about 1 week (around 12/13/2020) for OFFICE OB VISIT (MD or APP).  Future Appointments  Date Time Provider Department Center  12/13/2020  2:10 PM [place] Bing, MD [facility]  12/20/2020 12:00 AM [facility] SCHED ROOM [facility] None    Jody Collins, MD

## 2020-12-06 NOTE — Patient Instructions (Addendum)
Return to office for any scheduled appointments. Call the office or go to the MAU at [facility] if: You begin to have strong, frequent contractions Your water breaks.  Sometimes it is a big gush of fluid, sometimes it is just a trickle that keeps getting your panties wet or running down your legs You have vaginal bleeding.  It is normal to have a small amount of spotting if your cervix was checked.  You do not feel your baby moving like normal.  If you do not, get something to eat and drink and lay down and focus on feeling your baby move.   If your baby is still not moving like normal, you should call the office or go to MAU. Any other obstetric concerns.    Cervical Ripening (to get your cervix ready for labor) : May try one or all:  Red Raspberry Leaf capsules:  two 300mg  or 400mg  tablets with each meal, 2-3 times a day  Potential Side Effects Of Raspberry Leaf:  Most women do not experience any side effects from drinking raspberry leaf tea. However, nausea and loose stools are possible   Evening Primrose Oil capsules: may take 1 to 3 capsules daily. May also prick one to release the oil and insert it into your vagina at night.  Some of the potential side effects:  Upset stomach  Loose stools or diarrhea  Headaches  Nausea  6 Dates a day (may taste better if warmed in microwave until soft). Found where raisins are in the grocery store

## 2020-12-09 ENCOUNTER — Other Ambulatory Visit (HOSPITAL_COMMUNITY): Payer: Self-pay | Admitting: Advanced Practice Midwife

## 2020-12-13 ENCOUNTER — Other Ambulatory Visit: Payer: Self-pay

## 2020-12-13 ENCOUNTER — Ambulatory Visit (INDEPENDENT_AMBULATORY_CARE_PROVIDER_SITE_OTHER): Payer: BC Managed Care – PPO | Admitting: Obstetrics and Gynecology

## 2020-12-13 VITALS — BP 127/76 | HR 90 | Wt 173.0 lb

## 2020-12-13 DIAGNOSIS — Z3A39 39 weeks gestation of pregnancy: Secondary | ICD-10-CM

## 2020-12-13 NOTE — Progress Notes (Signed)
° °  PRENATAL VISIT NOTE  Subjective:  Jody Cunningham is a 35 y.o. G2P1001 at [redacted]w[redacted]d being seen today for ongoing prenatal care.  She is currently monitored for the following issues for this low-risk pregnancy and has Rh negative, antepartum and Encounter for supervision of normal first pregnancy in third trimester on their problem list.  Patient reports no complaints.  Contractions: Not present. Vag. Bleeding: None.  Movement: Present. Denies leaking of fluid.   The following portions of the patient's history were reviewed and updated as appropriate: allergies, current medications, past family history, past medical history, past social history, past surgical history and problem list.   Objective:   Vitals:   12/13/20 1435  BP: 127/76  Pulse: 90  Weight: 173 lb (78.5 kg)    Fetal Status: Fetal Heart Rate (bpm): 149 Fundal Height: 39 cm Movement: Present  Presentation: Vertex  General:  Alert, oriented and cooperative. Patient is in no acute distress.  Skin: Skin is warm and dry. No rash noted.   Cardiovascular: Normal heart rate noted  Respiratory: Normal respiratory effort, no problems with respiration noted  Abdomen: Soft, gravid, appropriate for gestational age.  Pain/Pressure: Present     Pelvic: Cervical exam deferred Dilation: 1.5 Effacement (%): 50 Station: Ballotable  Extremities: Normal range of motion.  Edema: None  Mental Status: Normal mood and affect. Normal behavior. Normal judgment and thought content.   Assessment and Plan:  Pregnancy: G2P1001 at [redacted]w[redacted]d 1. [redacted] weeks gestation of pregnancy Routine care. Has elective IOL for 12/19 at 2345. GBS neg  Term labor symptoms and general obstetric precautions including but not limited to vaginal bleeding, contractions, leaking of fluid and fetal movement were reviewed in detail with the patient. Please refer to After Visit Summary for other counseling recommendations.   Return if symptoms worsen or fail to  improve.  Future Appointments  Date Time Provider Department Center  12/20/2020 12:00 AM [facility] SCHED ROOM [facility] None  01/24/2021  2:30 PM Anyanwu, Jethro Bastos, MD [facility]    [place] Bing, MD

## 2020-12-20 ENCOUNTER — Inpatient Hospital Stay (HOSPITAL_COMMUNITY): Payer: BC Managed Care – PPO

## 2020-12-20 ENCOUNTER — Encounter (HOSPITAL_COMMUNITY): Payer: Self-pay | Admitting: Obstetrics & Gynecology

## 2020-12-20 ENCOUNTER — Inpatient Hospital Stay (HOSPITAL_COMMUNITY)
Admission: AD | Admit: 2020-12-20 | Discharge: 2020-12-22 | DRG: 807 | Disposition: A | Discharge status: Home / Self Care | Payer: BC Managed Care – PPO | Attending: Obstetrics & Gynecology | Admitting: Obstetrics & Gynecology

## 2020-12-20 ENCOUNTER — Other Ambulatory Visit: Payer: Self-pay

## 2020-12-20 ENCOUNTER — Inpatient Hospital Stay (HOSPITAL_COMMUNITY): Payer: BC Managed Care – PPO | Admitting: Anesthesiology

## 2020-12-20 DIAGNOSIS — O480 Post-term pregnancy: Secondary | ICD-10-CM | POA: Diagnosis present

## 2020-12-20 DIAGNOSIS — O26899 Other specified pregnancy related conditions, unspecified trimester: Secondary | ICD-10-CM

## 2020-12-20 DIAGNOSIS — O4202 Full-term premature rupture of membranes, onset of labor within 24 hours of rupture: Secondary | ICD-10-CM | POA: Diagnosis not present

## 2020-12-20 DIAGNOSIS — Z20822 Contact with and (suspected) exposure to covid-19: Secondary | ICD-10-CM | POA: Diagnosis present

## 2020-12-20 DIAGNOSIS — Z3A40 40 weeks gestation of pregnancy: Secondary | ICD-10-CM

## 2020-12-20 DIAGNOSIS — Z6791 Unspecified blood type, Rh negative: Secondary | ICD-10-CM | POA: Diagnosis not present

## 2020-12-20 DIAGNOSIS — Z8616 Personal history of COVID-19: Secondary | ICD-10-CM

## 2020-12-20 DIAGNOSIS — O26893 Other specified pregnancy related conditions, third trimester: Secondary | ICD-10-CM | POA: Diagnosis present

## 2020-12-20 DIAGNOSIS — Z3403 Encounter for supervision of normal first pregnancy, third trimester: Secondary | ICD-10-CM

## 2020-12-20 LAB — CBC
HCT: 36.7 % (ref 36.0–46.0)
Hemoglobin: 12.7 g/dL (ref 12.0–15.0)
MCH: 32.2 pg (ref 26.0–34.0)
MCHC: 34.6 g/dL (ref 30.0–36.0)
MCV: 93.1 fL (ref 80.0–100.0)
Platelets: 259 10*3/uL (ref 150–400)
RBC: 3.94 MIL/uL (ref 3.87–5.11)
RDW: 13.5 % (ref 11.5–15.5)
WBC: 11 10*3/uL — ABNORMAL HIGH (ref 4.0–10.5)
nRBC: 0 % (ref 0.0–0.2)

## 2020-12-20 LAB — RESP PANEL BY RT-PCR (FLU A&B, COVID) ARPGX2
Influenza A by PCR: NEGATIVE
Influenza B by PCR: NEGATIVE
SARS Coronavirus 2 by RT PCR: NEGATIVE

## 2020-12-20 LAB — TYPE AND SCREEN
ABO/RH(D): O NEG
Antibody Screen: POSITIVE

## 2020-12-20 LAB — RPR: RPR Ser Ql: NONREACTIVE

## 2020-12-20 MED ORDER — TETANUS-DIPHTH-ACELL PERTUSSIS 5-2.5-18.5 LF-MCG/0.5 IM SUSY: 0.5000 mL | PREFILLED_SYRINGE | Freq: Once | INTRAMUSCULAR | Status: DC

## 2020-12-20 MED ORDER — LIDOCAINE HCL (PF) 1 % IJ SOLN
INTRAMUSCULAR | Status: DC | PRN
  Administered 2020-12-20 (×2): 5 mL via EPIDURAL

## 2020-12-20 MED ORDER — OXYCODONE-ACETAMINOPHEN 5-325 MG PO TABS: 1.0000 | ORAL_TABLET | ORAL | Status: DC | PRN

## 2020-12-20 MED ORDER — COCONUT OIL OIL: 1.0000 "application " | TOPICAL_OIL | Status: DC | PRN

## 2020-12-20 MED ORDER — MEASLES, MUMPS & RUBELLA VAC IJ SOLR: 0.5000 mL | Freq: Once | INTRAMUSCULAR | Status: DC

## 2020-12-20 MED ORDER — PHENYLEPHRINE 40 MCG/ML (10ML) SYRINGE FOR IV PUSH (FOR BLOOD PRESSURE SUPPORT): 80.0000 ug | PREFILLED_SYRINGE | INTRAVENOUS | Status: DC | PRN

## 2020-12-20 MED ORDER — FENTANYL CITRATE (PF) 100 MCG/2ML IJ SOLN: 50.0000 ug | INTRAMUSCULAR | Status: DC | PRN

## 2020-12-20 MED ORDER — OXYCODONE-ACETAMINOPHEN 5-325 MG PO TABS: 2.0000 | ORAL_TABLET | ORAL | Status: DC | PRN

## 2020-12-20 MED ORDER — PRENATAL MULTIVITAMIN CH
1.0000 | ORAL_TABLET | Freq: Every day | ORAL | Status: DC
  Administered 2020-12-21 – 2020-12-22 (×2): 1 via ORAL
  Filled 2020-12-20 (×2): qty 1

## 2020-12-20 MED ORDER — ACETAMINOPHEN 325 MG PO TABS: 650.0000 mg | ORAL_TABLET | ORAL | Status: DC | PRN

## 2020-12-20 MED ORDER — TERBUTALINE SULFATE 1 MG/ML IJ SOLN: 0.2500 mg | Freq: Once | INTRAMUSCULAR | Status: DC | PRN

## 2020-12-20 MED ORDER — IBUPROFEN 600 MG PO TABS: 600.0000 mg | ORAL_TABLET | Freq: Once | ORAL | Status: DC

## 2020-12-20 MED ORDER — ONDANSETRON HCL 4 MG PO TABS: 4.0000 mg | ORAL_TABLET | ORAL | Status: DC | PRN

## 2020-12-20 MED ORDER — SIMETHICONE 80 MG PO CHEW: 80.0000 mg | CHEWABLE_TABLET | ORAL | Status: DC | PRN

## 2020-12-20 MED ORDER — IBUPROFEN 600 MG PO TABS
600.0000 mg | ORAL_TABLET | Freq: Four times a day (QID) | ORAL | Status: DC
  Administered 2020-12-21 – 2020-12-22 (×7): 600 mg via ORAL
  Filled 2020-12-20 (×7): qty 1

## 2020-12-20 MED ORDER — LACTATED RINGERS IV SOLN: 500.0000 mL | INTRAVENOUS | Status: DC | PRN

## 2020-12-20 MED ORDER — DIPHENHYDRAMINE HCL 50 MG/ML IJ SOLN: 12.5000 mg | INTRAMUSCULAR | Status: DC | PRN

## 2020-12-20 MED ORDER — SOD CITRATE-CITRIC ACID 500-334 MG/5ML PO SOLN: 30.0000 mL | ORAL | Status: DC | PRN

## 2020-12-20 MED ORDER — OXYTOCIN-SODIUM CHLORIDE 30-0.9 UT/500ML-% IV SOLN
1.0000 m[IU]/min | INTRAVENOUS | Status: DC
  Administered 2020-12-20: 08:00:00 2 m[IU]/min via INTRAVENOUS
  Filled 2020-12-20: qty 500

## 2020-12-20 MED ORDER — LIDOCAINE HCL (PF) 1 % IJ SOLN: 30.0000 mL | INTRAMUSCULAR | Status: DC | PRN

## 2020-12-20 MED ORDER — ONDANSETRON HCL 4 MG/2ML IJ SOLN
4.0000 mg | Freq: Four times a day (QID) | INTRAMUSCULAR | Status: DC | PRN
  Administered 2020-12-20: 12:00:00 4 mg via INTRAVENOUS
  Filled 2020-12-20: qty 2

## 2020-12-20 MED ORDER — LACTATED RINGERS IV SOLN
INTRAVENOUS | Status: DC
  Administered 2020-12-20: 01:00:00 950 mL via INTRAVENOUS

## 2020-12-20 MED ORDER — DIBUCAINE (PERIANAL) 1 % EX OINT: 1.0000 "application " | TOPICAL_OINTMENT | CUTANEOUS | Status: DC | PRN

## 2020-12-20 MED ORDER — OXYTOCIN-SODIUM CHLORIDE 30-0.9 UT/500ML-% IV SOLN: 2.5000 [IU]/h | INTRAVENOUS | Status: DC

## 2020-12-20 MED ORDER — FENTANYL-BUPIVACAINE-NACL 0.5-0.125-0.9 MG/250ML-% EP SOLN
12.0000 mL/h | EPIDURAL | Status: DC | PRN
  Administered 2020-12-20: 11:00:00 12 mL/h via EPIDURAL
  Filled 2020-12-20: qty 250

## 2020-12-20 MED ORDER — MAGNESIUM HYDROXIDE 400 MG/5ML PO SUSP: 30.0000 mL | ORAL | Status: DC | PRN

## 2020-12-20 MED ORDER — PHENYLEPHRINE 40 MCG/ML (10ML) SYRINGE FOR IV PUSH (FOR BLOOD PRESSURE SUPPORT)
80.0000 ug | PREFILLED_SYRINGE | INTRAVENOUS | Status: DC | PRN
  Filled 2020-12-20: qty 10

## 2020-12-20 MED ORDER — MISOPROSTOL 50MCG HALF TABLET
50.0000 ug | ORAL_TABLET | ORAL | Status: DC | PRN
  Administered 2020-12-20: 03:00:00 50 ug via BUCCAL
  Filled 2020-12-20: qty 1

## 2020-12-20 MED ORDER — ZOLPIDEM TARTRATE 5 MG PO TABS: 5.0000 mg | ORAL_TABLET | Freq: Every evening | ORAL | Status: DC | PRN

## 2020-12-20 MED ORDER — LACTATED RINGERS IV SOLN
500.0000 mL | Freq: Once | INTRAVENOUS | Status: AC
  Administered 2020-12-20: 11:00:00 500 mL via INTRAVENOUS

## 2020-12-20 MED ORDER — EPHEDRINE 5 MG/ML INJ: 10.0000 mg | INTRAVENOUS | Status: DC | PRN

## 2020-12-20 MED ORDER — ONDANSETRON HCL 4 MG/2ML IJ SOLN: 4.0000 mg | INTRAMUSCULAR | Status: DC | PRN

## 2020-12-20 MED ORDER — WITCH HAZEL-GLYCERIN EX PADS: 1.0000 "application " | MEDICATED_PAD | CUTANEOUS | Status: DC | PRN

## 2020-12-20 MED ORDER — OXYTOCIN BOLUS FROM INFUSION: 333.0000 mL | Freq: Once | INTRAVENOUS | Status: DC

## 2020-12-20 MED ORDER — HYDROXYZINE HCL 50 MG PO TABS: 50.0000 mg | ORAL_TABLET | Freq: Four times a day (QID) | ORAL | Status: DC | PRN

## 2020-12-20 MED ORDER — BENZOCAINE-MENTHOL 20-0.5 % EX AERO: 1.0000 "application " | INHALATION_SPRAY | CUTANEOUS | Status: DC | PRN

## 2020-12-20 MED ORDER — DIPHENHYDRAMINE HCL 25 MG PO CAPS: 25.0000 mg | ORAL_CAPSULE | Freq: Four times a day (QID) | ORAL | Status: DC | PRN

## 2020-12-20 MED ORDER — MISOPROSTOL 25 MCG QUARTER TABLET: 25.0000 ug | ORAL_TABLET | ORAL | Status: DC | PRN

## 2020-12-20 MED ORDER — FLEET ENEMA 7-19 GM/118ML RE ENEM: 1.0000 | ENEMA | Freq: Every day | RECTAL | Status: DC | PRN

## 2020-12-20 NOTE — Anesthesia Preprocedure Evaluation (Signed)
Anesthesia Evaluation  Patient identified by MRN, date of birth, ID band Patient awake    Reviewed: Allergy & Precautions, NPO status , Patient's Chart, lab work & pertinent test results  Airway Mallampati: II  TM Distance: >3 FB Neck ROM: Full    Dental no notable dental hx. (+) Dental Advisory Given   Pulmonary neg pulmonary ROS,    Pulmonary exam normal        Cardiovascular negative cardio ROS Normal cardiovascular exam     Neuro/Psych negative neurological ROS  negative psych ROS   GI/Hepatic negative GI ROS, Neg liver ROS,   Endo/Other  negative endocrine ROS  Renal/GU negative Renal ROS  negative genitourinary   Musculoskeletal negative musculoskeletal ROS (+)   Abdominal   Peds negative pediatric ROS (+)  Hematology negative hematology ROS (+)   Anesthesia Other Findings   Reproductive/Obstetrics (+) Pregnancy                             Anesthesia Physical Anesthesia Plan  ASA: 2  Anesthesia Plan: Epidural   Post-op Pain Management:    Induction:   PONV Risk Score and Plan:   Airway Management Planned: Natural Airway  Additional Equipment:   Intra-op Plan:   Post-operative Plan:   Informed Consent: I have reviewed the patients History and Physical, chart, labs and discussed the procedure including the risks, benefits and alternatives for the proposed anesthesia with the patient or authorized representative who has indicated his/her understanding and acceptance.       Plan Discussed with: Anesthesiologist  Anesthesia Plan Comments:         Anesthesia Quick Evaluation

## 2020-12-20 NOTE — Plan of Care (Signed)
Dagan Heinz, RN 

## 2020-12-20 NOTE — Progress Notes (Signed)
Jody Cunningham is a 35 y.o. G2P1001 at [redacted]w[redacted]d   Subjective: Aware of contractions s/p epidural, denies pain  Objective: BP (!) 111/55    Pulse 71    Temp 98.1 F (36.7 C) (Oral)    Resp 18    Ht 5\' 9"  (1.753 m)    Wt 78.6 kg    LMP 03/15/2020    BMI 25.58 kg/m  No intake/output data recorded. No intake/output data recorded.  FHT:  FHR: 145 bpm, variability: moderate,  accelerations:  Present,  decelerations:  Absent UC:   regular, every 2-4 minutes SVE:   Dilation: 6 Effacement (%): 100 Station: -3 Exam by:: [place], CNM  Labs: Lab Results  Component Value Date   WBC 11.0 (H) 12/20/2020   HGB 12.7 12/20/2020   HCT 36.7 12/20/2020   MCV 93.1 12/20/2020   PLT 259 12/20/2020    Assessment / Plan: --Returned to bedside 2 hours elapsed since previous exam --Baby continues to be -3 to ballotable, tucked into maternal right pelvis --AROM deferred due to poor fetal engagement --Continue Pitocin titration --Assess for AROM with future checks, discussed possible use of FSE for slow leak --Anticipate vaginal delivery  12/22/2020, CNM 12/20/2020, [phone]

## 2020-12-20 NOTE — Discharge Summary (Signed)
Postpartum Discharge Summary      Patient Name: Jody Cunningham DOB: [birth year] MRN: [id number]  Date of admission: 12/20/2020 Delivery date:12/20/2020  Delivering provider: Darlina Rumpf  Date of discharge: 12/21/2020  Admitting diagnosis: Post term pregnancy over 40 weeks [O48.0] Intrauterine pregnancy: [redacted]w[redacted]d    Secondary diagnosis:  Principal Problem:   Post term pregnancy over 40 weeks Active Problems:   Rh negative, antepartum   Encounter for supervision of normal first pregnancy in third trimester  Additional problems: N/A    Discharge diagnosis: Term Pregnancy Delivered                                              Post partum procedures: Rhogam evaluation Augmentation: Pitocin, Cytotec, and IP Foley Complications: None  Hospital course: Induction of Labor With Vaginal Delivery   35y.o. yo G2P1001 at 487w0das admitted to the hospital 12/20/2020 for induction of labor.  Indication for induction: Elective.  Patient had an uncomplicated labor course as follows: Membrane Rupture Time/Date: 5:00 PM ,12/20/2020   Delivery Method:Vaginal, Spontaneous  Episiotomy: None  Lacerations:  None  Details of delivery can be found in separate delivery note.  Patient had a routine postpartum course. Patient is discharged home 12/21/20.  Newborn Data: Birth date:12/20/2020  Birth time:5:38 PM  Gender:Female  Living status:Living  Apgars:9 ,9  Weight:4230 g   Magnesium Sulfate received: No BMZ received: No Rhophylac:Yes MMR:N/A T-DaP:Given prenatally Flu: given prenatally Transfusion:No  Physical exam  Vitals:   12/20/20 1951 12/20/20 2030 12/21/20 0017 12/21/20 0400  BP: 133/71 130/72 (!) 106/56 (!) 109/56  Pulse: 63 60 87 65  Resp: '17 18 18 18  ' Temp: 98.2 F (36.8 C) 98.2 F (36.8 C) 98 F (36.7 C) 98.4 F (36.9 C)  TempSrc: Oral Oral Oral Oral  SpO2: 100% 100% 100% 100%  Weight:      Height:       General: alert Lochia: appropriate Uterine  Fundus: firm Incision: N/A DVT Evaluation: No evidence of DVT seen on physical exam. Labs: Lab Results  Component Value Date   WBC 11.0 (H) 12/20/2020   HGB 12.7 12/20/2020   HCT 36.7 12/20/2020   MCV 93.1 12/20/2020   PLT 259 12/20/2020   CMP Latest Ref Rng & Units 03/26/2019  Glucose 65 - 99 mg/dL 88  BUN 6 - 20 mg/dL 8  Creatinine 0.57 - 1.00 mg/dL 0.75  Sodium 134 - 144 mmol/L 138  Potassium 3.5 - 5.2 mmol/L 4.2  Chloride 96 - 106 mmol/L 105  CO2 20 - 29 mmol/L 23  Calcium 8.7 - 10.2 mg/dL 8.8  Total Protein 6.0 - 8.5 g/dL 7.0  Total Bilirubin 0.0 - 1.2 mg/dL 0.3  Alkaline Phos 39 - 117 IU/L 65  AST 0 - 40 IU/L 19  ALT 0 - 32 IU/L 20   Edinburgh Score: Edinburgh Postnatal Depression Scale Screening Tool 12/21/2020  I have been able to laugh and see the funny side of things. 0  I have looked forward with enjoyment to things. 0  I have blamed myself unnecessarily when things went wrong. 0  I have been anxious or worried for no good reason. 0  I have felt scared or panicky for no good reason. 0  Things have been getting on top of me. 0  I have been so unhappy that I have  had difficulty sleeping. 0  I have felt sad or miserable. 0  I have been so unhappy that I have been crying. 0  The thought of harming myself has occurred to me. 0  Edinburgh Postnatal Depression Scale Total 0     After visit meds:  Allergies as of 12/21/2020       Reactions   Ceclor [cefaclor] Rash        Medication List     STOP taking these medications    Doxylamine-Pyridoxine 10-10 MG Tbec Commonly known as: Diclegis   loratadine 10 MG tablet Commonly known as: Claritin       TAKE these medications    acetaminophen 325 MG tablet Commonly known as: Tylenol Take 2 tablets (650 mg total) by mouth every 4 (four) hours as needed (for pain scale < 4).   ibuprofen 600 MG tablet Commonly known as: ADVIL Take 1 tablet (600 mg total) by mouth every 6 (six) hours.   PRENATAL  VITAMINS PO Take by mouth.         Discharge home in stable condition Infant Feeding: Breast Infant Disposition:home with mother Discharge instruction: per After Visit Summary and Postpartum booklet. Activity: Advance as tolerated. Pelvic rest for 6 weeks.  Diet: routine diet Future Appointments: Future Appointments  Date Time Provider [facility]  01/24/2021  2:30 PM Anyanwu, Sallyanne Havers, MD [facility]    Please schedule this patient for a Virtual postpartum visit in 6 weeks with the following provider: Any provider. Additional Postpartum F/U: N/A   Low risk pregnancy complicated by:  N/A Delivery mode:  Vaginal, Spontaneous  Anticipated Birth Control:   Partner vasectomy   Renard Matter, MD, MPH OB Fellow, Faculty [facility]

## 2020-12-20 NOTE — Progress Notes (Signed)
Jody Cunningham is a 35 y.o. G2P1001 at [redacted]w[redacted]d   Subjective: Resting comfortably in left lateral. Partner at bedside and engaged in care. Denies questions or concerns at this time.  Objective: BP 120/67    Pulse 71    Temp 98.1 F (36.7 C) (Oral)    Resp 16    Ht 5\' 9"  (1.753 m)    Wt 78.6 kg    LMP 03/15/2020    BMI 25.58 kg/m  No intake/output data recorded. No intake/output data recorded.  FHT:  FHR: 140 bpm, variability: moderate,  accelerations:  Present,  decelerations:  Absent UC:   irregular, every 3-7 minutes SVE:   Dilation: 5 Effacement (%): 60 Station: Ballotable Exam by:: Dr. [email or web address]  Labs: Lab Results  Component Value Date   WBC 11.0 (H) 12/20/2020   HGB 12.7 12/20/2020   HCT 36.7 12/20/2020   MCV 93.1 12/20/2020   PLT 259 12/20/2020    Assessment / Plan: --35 y.o. G2P1001 at [redacted]w[redacted]d  --Cat I tracing --GBS Neg --Pitocin initiated at 0800, currently infusing at 6 milliunits, continue titration PRN --Consider AROM with next exam at around 12pm --Anticipate vaginal delivery  [redacted]w[redacted]d, CNM 12/20/2020, [phone]

## 2020-12-20 NOTE — Progress Notes (Signed)
Zian Mohamed is a 35 y.o. G2P1001 at [redacted]w[redacted]d admitted for elective induction of labor  Subjective: Feeling contractions  Objective: BP 118/61    Pulse 77    Temp 98.1 F (36.7 C) (Oral)    Resp 16    Ht 5\' 9"  (1.753 m)    Wt 78.6 kg    LMP 03/15/2020    BMI 25.58 kg/m  No intake/output data recorded. No intake/output data recorded.  FHT:  FHR: 150 bpm, variability: moderate,  accelerations:  Present,  decelerations:  Absent UC:   regular, every 3-5 minutes SVE:   Dilation: 5 Effacement (%): 60 Station: Ballotable Exam by:: Dr. [email or web address]  Labs: Lab Results  Component Value Date   WBC 11.0 (H) 12/20/2020   HGB 12.7 12/20/2020   HCT 36.7 12/20/2020   MCV 93.1 12/20/2020   PLT 259 12/20/2020    Assessment / Plan: G2P1001 at [redacted]w[redacted]d admitted for elective induction of labor  Labor:  s/p cytotec#1 and foley balloon. Now 4-5 cm. Will start pitocin 2x2. Head ballotable. Will assess for AROM at next check Fetal Wellbeing:  Category I Pain Control: plans epidural   [redacted]w[redacted]d 12/20/2020, 9:19 AM

## 2020-12-20 NOTE — Anesthesia Procedure Notes (Signed)
Epidural Patient location during procedure: OB Start time: 12/20/2020 11:17 AM End time: 12/20/2020 11:30 AM  Staffing Anesthesiologist: Heather Roberts, MD Performed: anesthesiologist   Preanesthetic Checklist Completed: patient identified, IV checked, site marked, risks and benefits discussed, monitors and equipment checked, pre-op evaluation and timeout performed  Epidural Patient position: sitting Prep: DuraPrep Patient monitoring: heart rate, cardiac monitor, continuous pulse ox and blood pressure Approach: midline Location: L2-L3 Injection technique: LOR saline  Needle:  Needle type: Tuohy  Needle gauge: 17 G Needle length: 9 cm Needle insertion depth: 5 cm Catheter size: 20 Guage Catheter at skin depth: 10 cm Test dose: negative and Other  Assessment Events: blood not aspirated, injection not painful, no injection resistance and negative IV test  Additional Notes Informed consent obtained prior to proceeding including risk of failure, 1% risk of PDPH, risk of minor discomfort and bruising.  Discussed rare but serious complications including epidural abscess, permanent nerve injury, epidural hematoma.  Discussed alternatives to epidural analgesia and patient desires to proceed.  Timeout performed pre-procedure verifying patient name, procedure, and platelet count.  Patient tolerated procedure well.

## 2020-12-20 NOTE — Progress Notes (Signed)
Sherina Stammer is a 35 y.o. G2P1001 at [redacted]w[redacted]d   Subjective: Aware of contractions, denies pain, pressure.   Objective: BP 113/67    Pulse 64    Temp 98.1 F (36.7 C) (Oral)    Resp 16    Ht 5\' 9"  (1.753 m)    Wt 78.6 kg    LMP 03/15/2020    BMI 25.58 kg/m  No intake/output data recorded. No intake/output data recorded.  FHT:  FHR: 145 bpm, variability: moderate,  accelerations:  Present,  decelerations:  Absent UC:   regular, every 2-3 minutes SVE:   Dilation: 6 Effacement (%): 100 Station: -3 Exam by:: Vivian, CNM  Labs: Lab Results  Component Value Date   WBC 11.0 (H) 12/20/2020   HGB 12.7 12/20/2020   HCT 36.7 12/20/2020   MCV 93.1 12/20/2020   PLT 259 12/20/2020    Assessment / Plan: --35 y.o. G2P1001 at [redacted]w[redacted]d  --Cat I tracing --Cervical change from 5-6 from previous exam --Baby still not engaged in pelvis, -3, not well applied --Will reposition to more upright options, reassess for AROM in two hours --Anticipate vaginal birth  [redacted]w[redacted]d, Calvert Cantor 12/20/2020, 1230

## 2020-12-20 NOTE — H&P (Signed)
OBSTETRIC ADMISSION HISTORY AND PHYSICAL Jody Cunningham is a 35 y.o. female G2P1001 with IUP at [redacted]w[redacted]d by LMP presenting for elective IOL at term. She reports +FMs, No LOF, no VB, no blurry vision, headaches or peripheral edema, and RUQ pain.  She plans on breast and bottle feeding. She is considering partner vasectomy for birth control. She received her prenatal care at  [facility]    Dating: By LMP --->  Estimated Date of Delivery: 12/20/20  Sono:   @[redacted]w[redacted]d , CWD, normal anatomy, cephalic presentation, anterior placental lie, 644g, 74% EFW   Prenatal History/Complications:  Rh negative, received Rhogam on 09/29/2020  Past Medical History: Past Medical History:  Diagnosis Date   History of COVID-19 03/26/2019    Past Surgical History: Past Surgical History:  Procedure Laterality Date   TONSILLECTOMY     WISDOM TOOTH EXTRACTION  2003    Obstetrical History: OB History     Gravida  2   Para  1   Term  1   Preterm      AB      Living  1      SAB      IAB      Ectopic      Multiple  0   Live Births  1           Social History Social History   Socioeconomic History   Marital status: Married    Spouse name: Not on file   Number of children: Not on file   Years of education: Not on file   Highest education level: Not on file  Occupational History   Not on file  Tobacco Use   Smoking status: Never   Smokeless tobacco: Never  Vaping Use   Vaping Use: Never used  Substance and Sexual Activity   Alcohol use: Not Currently    Comment: socially   Drug use: Never   Sexual activity: Yes    Partners: Male    Birth control/protection: None  Other Topics Concern   Not on file  Social History Narrative   Married.   From [place]   Social Determinants of Health   Financial Resource Strain: Not on file  Food Insecurity: Not on file  Transportation Needs: Not on file  Physical Activity: Not on file  Stress: Not on file  Social  Connections: Not on file    Family History: Family History  Problem Relation Age of Onset   Cancer Maternal Grandmother    Thyroid disease Mother    Hypertension Mother    GER disease Father    Parkinson's disease Paternal Grandfather     Allergies: Allergies  Allergen Reactions   Ceclor [Cefaclor] Rash    Medications Prior to Admission  Medication Sig Dispense Refill Last Dose   Doxylamine-Pyridoxine (DICLEGIS) 10-10 MG TBEC Take 2 tablets by mouth at bedtime. If symptoms persist, add one tablet in the morning and one in the afternoon 100 tablet 5 Past Week   Prenatal Vit-Fe Fumarate-FA (PRENATAL VITAMINS PO) Take by mouth.   12/20/2020   loratadine (CLARITIN) 10 MG tablet Take 1 tablet (10 mg total) by mouth daily. 30 tablet 0      Review of Systems   All systems reviewed and negative except as stated in HPI  Blood pressure 125/73, pulse (!) 59, temperature 98.3 F (36.8 C), temperature source Oral, resp. rate 18, height 5\' 9"  (1.753 m), weight 78.6 kg, last menstrual period 03/15/2020. General appearance: alert Lungs: clear  to auscultation bilaterally Heart: regular rate and rhythm Abdomen: soft, non-tender; bowel sounds normal Extremities: Homans sign is negative, no sign of DVT Presentation: cephalic, confirmed by BSUS   Fetal monitoringBaseline: 140 bpm, Variability: Good {> 6 bpm), Accelerations: Reactive, and Decelerations: Absent Uterine activity irregular  Dilation: 1.5 Effacement (%): 50 Station: -2 Exam by:: Warner Mccreedy, MD   Prenatal labs: ABO, Rh: --/--/O NEG (12/20 0121) Antibody: POS (12/20 0121) Rubella: 3.20 (05/25 0929) RPR: Non Reactive (09/29 1051)  HBsAg: Negative (05/25 0929)  HIV: Non Reactive (09/29 1051)  GBS: Negative/-- (11/15 1450)  2 hr Glucola normal Genetic screening  normal Anatomy US normal  Prenatal Transfer Tool  Maternal Diabetes: No Genetic Screening: Normal Maternal Ultrasounds/Referrals: Normal Fetal Ultrasounds or  other Referrals:  None Maternal Substance Abuse:  No Significant Maternal Medications:  None Significant Maternal Lab Results: Group B Strep negative  Results for orders placed or performed during the hospital encounter of 12/20/20 (from the past 24 hour(s))  Type and screen   Collection Time: 12/20/20  1:21 AM  Result Value Ref Range   ABO/RH(D) O NEG    Antibody Screen POS    Sample Expiration      12/23/2020,2359 Performed at [facility], [address], [place], [place] [address]   CBC   Collection Time: 12/20/20  1:29 AM  Result Value Ref Range   WBC 11.0 (H) 4.0 - 10.5 K/uL   RBC 3.94 3.87 - 5.11 MIL/uL   Hemoglobin 12.7 12.0 - 15.0 g/dL   HCT 62.6 94.8 - 54.6 %   MCV 93.1 80.0 - 100.0 fL   MCH 32.2 26.0 - 34.0 pg   MCHC 34.6 30.0 - 36.0 g/dL   RDW 27.0 35.0 - 09.3 %   Platelets 259 150 - 400 K/uL   nRBC 0.0 0.0 - 0.2 %    Patient Active Problem List   Diagnosis Date Noted   Post term pregnancy over 40 weeks 12/20/2020   Encounter for supervision of normal first pregnancy in third trimester 05/25/2020   Rh negative, antepartum 09/05/2017    Assessment/Plan:  Jody Cunningham is a 35 y.o. G2P1001 at [redacted]w[redacted]d here for elective IOL at term  #Labor: Cervix 1.5 cm in office. At admission cervix ~1.5 to 2 cm. 50% effaced. Foley balloon placed and will also do cytotec 50 mcg buccally #Pain: Plans epidural #FWB: Cat I #ID:  GBS neg #MOF: both #MOC: partner vasectomy #Circ:  Yes  #Rh negative Received Rhogham on 09/29/2020. Will do rhogham eval PP with baby's blood type and redose rhogham if warranted.  Warner Mccreedy, MD  12/20/2020, 3:27 AM

## 2020-12-20 NOTE — Discharge Instructions (Signed)

## 2020-12-21 MED ORDER — ACETAMINOPHEN 325 MG PO TABS: 650.0000 mg | ORAL_TABLET | ORAL | 0 refills | Status: DC | PRN

## 2020-12-21 MED ORDER — IBUPROFEN 600 MG PO TABS: 600.0000 mg | ORAL_TABLET | Freq: Four times a day (QID) | ORAL | 0 refills | Status: DC

## 2020-12-21 MED ORDER — RHO D IMMUNE GLOBULIN 1500 UNIT/2ML IJ SOSY
300.0000 ug | PREFILLED_SYRINGE | Freq: Once | INTRAMUSCULAR | Status: AC
  Administered 2020-12-21: 12:00:00 300 ug via INTRAVENOUS
  Filled 2020-12-21: qty 2

## 2020-12-21 NOTE — Lactation Note (Signed)
This note was copied from a baby's chart. Lactation Consultation Note Attempted to see mom. Mom awake holding sleeping baby. Mom stated the baby is spitty and not very interested in BF right now. Asked mom if she would call for [facility] assistance today.  Patient Name: Jody Cunningham [facility] Date: 12/21/2020   Age:34 hours  Maternal Data    Feeding    LATCH Score                    Lactation Tools Discussed/Used    Interventions    Discharge    Consult Status      Charyl Dancer 12/21/2020, 1:45 AM

## 2020-12-21 NOTE — Anesthesia Postprocedure Evaluation (Signed)
Anesthesia Post Note  Patient: Taylie Helder  Procedure(s) Performed: AN AD HOC LABOR EPIDURAL     Patient location during evaluation: Mother Baby Anesthesia Type: Epidural Level of consciousness: awake and alert Pain management: pain level controlled Vital Signs Assessment: post-procedure vital signs reviewed and stable Respiratory status: spontaneous breathing, nonlabored ventilation and respiratory function stable Cardiovascular status: stable Postop Assessment: no headache, no backache and epidural receding Anesthetic complications: no   No notable events documented.  Last Vitals:  Vitals:   12/21/20 0017 12/21/20 0400  BP: (!) 106/56 (!) 109/56  Pulse: 87 65  Resp: 18 18  Temp: 36.7 C 36.9 C  SpO2: 100% 100%    Last Pain:  Vitals:   12/21/20 0556  TempSrc:   PainSc: 0-No pain   Pain Goal:                   Sadaf Przybysz

## 2020-12-21 NOTE — Lactation Note (Signed)
This note was copied from a baby's chart. Lactation Consultation Note  Patient Name: Jody Cunningham Date: 12/21/2020 Reason for consult: Follow-up assessment;Term;Breastfeeding assistance;Infant weight loss;Other (Comment);Mother's request (post circ) Age:34 hours 2nd LC visit - mom called .  Baby wide awake post circ / calmly resting in crib.    LC offered to place baby STS and to see if the baby would feed. Mom receptive. Baby assisted to latch on the right breast / football and worked On depth. Baby fed 12  mins with swallows. Latch score 8.  Per mom this is the bext he has fed so far, no spitting with feeding.  Baby released / STS with mom asleep.  Due to semi compressible areolas - LC recommended shells between feedings except when sleeping and prior to latch - breast massage, hand express, pre-pump with the hand pump to stretch the nipple / areola complex for a deeper latch.  Mom aware baby may bee sleepy due to the Tylenol.   Maternal Data Has patient been taught Hand Expression?: Yes Does the patient have breastfeeding experience prior to this delivery?: Yes How long did the patient breastfeed?: per mom difficult latch / pumped x 1 month  Feeding Mother's Current Feeding Choice: Breast Milk  LATCH Score Latch: Grasps breast easily, tongue down, lips flanged, rhythmical sucking.  Audible Swallowing: A few with stimulation  Type of Nipple: Everted at rest and after stimulation  Comfort (Breast/Nipple): Soft / non-tender  Hold (Positioning): Assistance needed to correctly position infant at breast and maintain latch.  LATCH Score: 8   Lactation Tools Discussed/Used Tools: Shells;Pump;Flanges Flange Size: 24 Breast pump type: Manual Pump Education: Milk Storage;Setup, frequency, and cleaning  Interventions Interventions: Breast feeding basics reviewed;Assisted with latch;Skin to skin;Breast massage;Hand express;Breast compression;Adjust  position;Support pillows;Shells;Hand pump;Education;LC Services brochure  Discharge Pump: Personal;DEBP;Manual  Consult Status Consult Status: Follow-up Date: 12/22/20 Follow-up type: In-patient    Jody Cunningham 12/21/2020, 11:25 AM

## 2020-12-21 NOTE — Lactation Note (Signed)
This note was copied from a baby's chart. Lactation Consultation Note  Patient Name: Jody Cunningham [id number] Date: 12/21/2020 Reason for consult: Initial assessment;Term;Infant weight loss;Other (Comment) (5 % weight loss/ per mom and dad baby has been spitty, last attempted at 645 am and baby spit the feeding up. P2 . Baby sleeping, not showing feeding cues. mom aware to call with feeding cues.) Age:34 hours LC reviewed doc flow sheets/ baby has been to the breast after delivery and since, voids, stools,spitting and correlates with 5 % weight loss.  Maternal Data    Feeding Mother's Current Feeding Choice: Breast Milk and Formula  LATCH Score                    Lactation Tools Discussed/Used    Interventions Interventions: Breast feeding basics reviewed;LC Services brochure  Discharge    Consult Status Consult Status: Follow-up Date: 12/21/20 Follow-up type: In-patient    Matilde Sprang Coralie Stanke 12/21/2020, 8:30 AM

## 2020-12-22 LAB — RH IG WORKUP (INCLUDES ABO/RH)
Fetal Screen: NEGATIVE
Gestational Age(Wks): 40
Unit division: 0

## 2020-12-22 NOTE — Discharge Summary (Addendum)
Postpartum Discharge Summary      Patient Name: Jody Cunningham DOB: Dec 23, 1985 MRN: [id number]  Date of admission: 12/20/2020 Delivery date:12/20/2020  Delivering provider: Darlina Rumpf  Date of discharge: 12/22/2020  Admitting diagnosis: Post term pregnancy over 40 weeks [O48.0] Intrauterine pregnancy: [redacted]w[redacted]d    Secondary diagnosis:  Principal Problem:   Post term pregnancy over 40 weeks Active Problems:   Rh negative, antepartum   Encounter for supervision of normal first pregnancy in third trimester  Additional problems: N/A    Discharge diagnosis: Term Pregnancy Delivered                                              Post partum procedures: Rhogam evaluation Augmentation: Pitocin, Cytotec, and IP Foley Complications: None  Hospital course: Induction of Labor With Vaginal Delivery   35y.o. yo G2P1001 at 46w0das admitted to the hospital 12/20/2020 for induction of labor.  Indication for induction: Elective.  Patient had an uncomplicated labor course as follows: Membrane Rupture Time/Date: 5:00 PM ,12/20/2020   Delivery Method:Vaginal, Spontaneous  Episiotomy: None  Lacerations:  None  Details of delivery can be found in separate delivery note.  Patient had a routine postpartum course. Patient is discharged home 12/22/20.  Newborn Data: Birth date:12/20/2020  Birth time:5:38 PM  Gender:Female  Living status:Living  Apgars:9 ,9  Weight:4230 g   Magnesium Sulfate received: No BMZ received: No Rhophylac:Yes MMR:N/A T-DaP:Given prenatally Flu: given prenatally Transfusion:No  Physical exam  Vitals:   12/21/20 0400 12/21/20 0915 12/21/20 2010 12/22/20 0543  BP: (!) 109/56 118/75 109/62 116/69  Pulse: 65 67 60 60  Resp: '18 16 18 18  ' Temp: 98.4 F (36.9 C) 97.8 F (36.6 C) 98.2 F (36.8 C) 97.9 F (36.6 C)  TempSrc: Oral Oral Oral Oral  SpO2: 100% 98% 100% 100%  Weight:      Height:       General: alert Lochia: appropriate Uterine Fundus:  firm Incision: N/A DVT Evaluation: No evidence of DVT seen on physical exam. Labs: Lab Results  Component Value Date   WBC 11.0 (H) 12/20/2020   HGB 12.7 12/20/2020   HCT 36.7 12/20/2020   MCV 93.1 12/20/2020   PLT 259 12/20/2020   CMP Latest Ref Rng & Units 03/26/2019  Glucose 65 - 99 mg/dL 88  BUN 6 - 20 mg/dL 8  Creatinine 0.57 - 1.00 mg/dL 0.75  Sodium 134 - 144 mmol/L 138  Potassium 3.5 - 5.2 mmol/L 4.2  Chloride 96 - 106 mmol/L 105  CO2 20 - 29 mmol/L 23  Calcium 8.7 - 10.2 mg/dL 8.8  Total Protein 6.0 - 8.5 g/dL 7.0  Total Bilirubin 0.0 - 1.2 mg/dL 0.3  Alkaline Phos 39 - 117 IU/L 65  AST 0 - 40 IU/L 19  ALT 0 - 32 IU/L 20   Edinburgh Score: Edinburgh Postnatal Depression Scale Screening Tool 12/21/2020  I have been able to laugh and see the funny side of things. 0  I have looked forward with enjoyment to things. 0  I have blamed myself unnecessarily when things went wrong. 0  I have been anxious or worried for no good reason. 0  I have felt scared or panicky for no good reason. 0  Things have been getting on top of me. 0  I have been so unhappy that I have had  difficulty sleeping. 0  I have felt sad or miserable. 0  I have been so unhappy that I have been crying. 0  The thought of harming myself has occurred to me. 0  Edinburgh Postnatal Depression Scale Total 0     After visit meds:  Allergies as of 12/22/2020       Reactions   Ceclor [cefaclor] Rash        Medication List     STOP taking these medications    Doxylamine-Pyridoxine 10-10 MG Tbec Commonly known as: Diclegis   loratadine 10 MG tablet Commonly known as: Claritin       TAKE these medications    acetaminophen 325 MG tablet Commonly known as: Tylenol Take 2 tablets (650 mg total) by mouth every 4 (four) hours as needed (for pain scale < 4).   ibuprofen 600 MG tablet Commonly known as: ADVIL Take 1 tablet (600 mg total) by mouth every 6 (six) hours.   PRENATAL VITAMINS  PO Take by mouth.         Discharge home in stable condition Infant Feeding: Breast Infant Disposition:home with mother Discharge instruction: per After Visit Summary and Postpartum booklet. Activity: Advance as tolerated. Pelvic rest for 6 weeks.  Diet: routine diet Future Appointments: Future Appointments  Date Time Provider [place]  01/24/2021  2:30 PM Anyanwu, Sallyanne Havers, MD [facility]    Please schedule this patient for a Virtual postpartum visit in 6 weeks with the following provider: Any provider. Additional Postpartum F/U: N/A   Low risk pregnancy complicated by:  N/A Delivery mode:  Vaginal, Spontaneous  Anticipated Birth Control:   Partner vasectomy   Marissa Dameron PGY1  GME ATTESTATION:  I saw and evaluated the patient. I agree with the findings and the plan of care as documented in the residents note and made all necessary edits.  Renard Matter, MD, MPH OB Fellow, [place] for [place] 12/22/2020 6:55 AM

## 2020-12-22 NOTE — Lactation Note (Signed)
This note was copied from a baby's chart. Lactation Consultation Note  Patient Name: Jody Cunningham XOVAN'V Date: 12/22/2020 Reason for consult: Mother's request Age:34 hours  This note reflects my last 2 visits into the room. I observed infant progressively feed better as Mom added breast compression on certain portions of her breast.  Mom has a Spectra pump with size 24 & 28 flanges, but Mom needs size 21 flanges or inserts for her size 24 flanges. Mom's R nipple was measured at 14 mm & L nipple at 15 mm. After some discussion, Mom will likely purchase size 19 flanges from [employer].  Mom and MGM are able to identify the sound of swallows. Mom & MGM were educated on pacifier use. Mom knows how to reach [place] for post-discharge questions.    [name] [facility] 12/22/2020, 1:21 PM

## 2020-12-22 NOTE — Lactation Note (Signed)
This note was copied from a baby's chart. Lactation Consultation Note  Patient Name: Jody Cunningham [id number] Date: 12/22/2020 Reason for consult: Follow-up assessment;Term Age:34 hours  Infant latched with ease to the breast without the nipple shield (using the teacup hold). Swallows were more frequent with breast compression. At this time, Mom needs to do breast compression to ensure adequate milk transfer. Parents would like [place] to return to assist with latch on the next breast.   Mom has good veining on breasts--she will likely have an adequate to abundant supply when her milk comes to volume. Mom will need a size 21 flange for her hand pump, which I will provide.   Parents know how to reach for [place] to return.   [name] [facility] 12/22/2020, 9:16 AM

## 2020-12-31 ENCOUNTER — Telehealth (HOSPITAL_COMMUNITY): Payer: Self-pay

## 2020-12-31 NOTE — Telephone Encounter (Signed)
No answer. Left message to return nurse call.  Marcelino Duster [facility] 12/31/2020,0928

## 2021-01-24 ENCOUNTER — Ambulatory Visit: Payer: BC Managed Care – PPO | Admitting: Obstetrics & Gynecology

## 2021-02-14 ENCOUNTER — Encounter: Payer: Self-pay | Admitting: Family Medicine

## 2021-02-14 ENCOUNTER — Other Ambulatory Visit: Payer: Self-pay

## 2021-02-14 ENCOUNTER — Ambulatory Visit (INDEPENDENT_AMBULATORY_CARE_PROVIDER_SITE_OTHER): Payer: 59 | Admitting: Family Medicine

## 2021-02-14 NOTE — Progress Notes (Signed)
° ° °  Post Partum Visit Note  Jody Cunningham is a 36 y.o. G52P2002 female who presents for a postpartum visit. She is 8 weeks postpartum following a normal spontaneous vaginal delivery.  I have fully reviewed the prenatal and intrapartum course. The delivery was at 40.0 gestational weeks.  Anesthesia: epidural. Postpartum course has been uncomplicated. Baby is doing well. Baby is feeding by breast. Bleeding no bleeding. Bowel function is normal. Bladder function is normal. Patient is not sexually active. Contraception method is vasectomy. Postpartum depression screening: negative.   The pregnancy intention screening data noted above was reviewed. Potential methods of contraception were discussed. The patient elected to proceed with No data recorded.    Health Maintenance Due  Topic Date Due   COVID-19 Vaccine (4 - Booster for Pfizer series) 02/23/2020    The following portions of the patient's history were reviewed and updated as appropriate: allergies, current medications, past family history, past medical history, past social history, past surgical history, and problem list.  Review of Systems Pertinent items are noted in HPI.  Objective:  There were no vitals taken for this visit.   General:  alert, cooperative, and appears stated age   Breasts:  not indicated  Lungs: clear to auscultation bilaterally  Heart:  regular rate and rhythm, S1, S2 normal, no murmur, click, rub or gallop  Abdomen: soft, non-tender; bowel sounds normal; no masses,  no organomegaly   Wound NA  GU exam:  not indicated       Assessment:    There are no diagnoses linked to this encounter.  Normal postpartum exam.   Plan:   Essential components of care per ACOG recommendations:  1.  Mood and well being: Patient with negative depression screening today. Reviewed local resources for support.  - Patient tobacco use? No.   - hx of drug use? No.    2. Infant care and feeding:  -Patient currently  breastmilk feeding? Yes. Discussed returning to work and pumping. Reviewed importance of draining breast regularly to support lactation.  -Social determinants of health (SDOH) reviewed in EPIC. No concerns  3. Sexuality, contraception and birth spacing - Patient does not want a pregnancy in the next year.  Desired family size is 2 children.  - Reviewed forms of contraception in tiered fashion. Patient desired no method today.   - Discussed birth spacing of 18 months  4. Sleep and fatigue -Encouraged family/partner/community support of 4 hrs of uninterrupted sleep to help with mood and fatigue  5. Physical Recovery  - Discussed patients delivery and complications. She describes her labor as good. - Patient had a Vaginal, no problems at delivery. Patient had a perineal abrasion. Perineal healing reviewed. Patient expressed understanding - Patient has urinary incontinence? No. - Patient is safe to resume physical and sexual activity  6.  Health Maintenance - HM due items addressed Yes - Last pap smear  Diagnosis  Date Value Ref Range Status  10/05/2019   Final   - Negative for intraepithelial lesion or malignancy (NILM)   Pap smear not done at today's visit.  -Breast Cancer screening indicated? No.   7. Chronic Disease/Pregnancy Condition follow up: None  - PCP follow up  Scheryl Marten, RN [facility] for [employer], [facility]

## 2021-09-18 DIAGNOSIS — R300 Dysuria: Secondary | ICD-10-CM | POA: Diagnosis not present

## 2021-09-18 DIAGNOSIS — N390 Urinary tract infection, site not specified: Secondary | ICD-10-CM | POA: Diagnosis not present

## 2021-11-15 ENCOUNTER — Encounter: Payer: Self-pay | Admitting: Internal Medicine

## 2021-12-13 ENCOUNTER — Telehealth (INDEPENDENT_AMBULATORY_CARE_PROVIDER_SITE_OTHER): Payer: 59 | Admitting: Family Medicine

## 2021-12-13 ENCOUNTER — Encounter: Payer: Self-pay | Admitting: Family Medicine

## 2021-12-13 VITALS — Temp 98.8°F | Ht 69.0 in | Wt 140.0 lb

## 2021-12-13 DIAGNOSIS — U071 COVID-19: Secondary | ICD-10-CM | POA: Diagnosis not present

## 2021-12-13 NOTE — Patient Instructions (Signed)
Continue ibuprofen and/or tylenol as needed for pain or fever. Drink plenty of fluids. Consider a decongestant such as pseudoephedrine, as needed for sinus pain. You can also try sinus rinses once or twice daily, if needed for sinus pain. Consider Mucinex (plain or DM) or Robitussin, if needed for thick mucus and cough.  Contact us if you have persistent fevers, worsening cough, shortness of breath, pain with breathing, or other new/concerning symptoms.    Day 0--first day of symptoms, 12/11. Isolation days 1-5 (12/12-16) Days 6-10 (12/17-21) masking, if fevers resolved, and your respiratory symptoms are improving.  If not improving or still having fever, you should continue isolation.

## 2021-12-13 NOTE — Progress Notes (Signed)
Start time: 3:08 End time:3:30  Virtual Visit via Video Note  I connected with Jody Cunningham on 12/13/21 by a video enabled telemedicine application and verified that I am speaking with the correct person using two identifiers.  Location: Patient: home Provider: office   I discussed the limitations of evaluation and management by telemedicine and the availability of in person appointments. The patient expressed understanding and agreed to proceed.  History of Present Illness:  Chief Complaint  Patient presents with   Covid Positive    VIRTUAL positive covid today (test expired 3/23). Symptoms are sinus pain and pressure, runny nose, slight cough, chills and gets hot. Not sure if she has a fever because she is taking ibuprofen. No body aches. Symptoms started on Monday with a ST.    Slight tickle in her throat started on 12/11. Yesterday she started having more sinus congestion, pressure in her face.  Felt hot/cold last night. Today she had increased congestion, sinus headache/pressure. Felt hot/cold all day today. She has been taking ibuprofen, hasn't checked temp prior to taking. No other OTC meds.  Nasal drainage is clear.  Only mild cough, bad taste in mouth when she coughs, but cough isn't productive.  2 kids--have been coughing. 3.5 yo preschool child starting coughing the night before patient started with symptoms. No other known sick contacts  Home COVID test was positive today (but expiration date was in March). She did two tests, both positive.   PMH, PSH, SH reviewed Husband with vasectomy  Had 3 COVID vaccines  Outpatient Encounter Medications as of 12/13/2021  Medication Sig Note   ibuprofen (ADVIL) 200 MG tablet Take 400 mg by mouth every 6 (six) hours as needed. 12/13/2021: Last dose 9am   [DISCONTINUED] acetaminophen (TYLENOL) 325 MG tablet Take 2 tablets (650 mg total) by mouth every 4 (four) hours as needed (for pain scale < 4). (Patient not taking:  Reported on 12/13/2021)    [DISCONTINUED] ibuprofen (ADVIL) 600 MG tablet Take 1 tablet (600 mg total) by mouth every 6 (six) hours.    [DISCONTINUED] Prenatal Vit-Fe Fumarate-FA (PRENATAL VITAMINS PO) Take by mouth.    No facility-administered encounter medications on file as of 12/13/2021.   Allergies  Allergen Reactions   Ceclor [Cefaclor] Rash   ROS: URI symptoms per HPI. Hot/cold. Sinus headaches Slight cough. No shortness of breath. No n/v/d. No rashes, bruising No body aches    Observations/Objective:  Temp 98.8 F (37.1 C) (Temporal)   Ht 5\' 9"  (1.753 m)   Wt 140 lb (63.5 kg)   LMP 12/04/2021 (Approximate)   Breastfeeding No   BMI 20.67 kg/m   Well-appearing, pleasant female, sounds slightly congested. No coughing during visit. She is alert and oriented. Cranial nerves are grossly normal. Normal mood, hygiene, speech. Exam is limited due to virtual nature of the visit.   Assessment and Plan:  COVID-19 virus infection - supportive measures and CDC guidelines (isolation/masking) discussed. Declines testing in office with non-expired test.  Continue ibuprofen and/or tylenol as needed for pain or fever. Drink plenty of fluids. Consider a decongestant such as pseudoephedrine, as needed for sinus pain. You can also try sinus rinses once or twice daily, if needed for sinus pain. Consider Mucinex (plain or DM) or Robitussin, if needed for thick mucus and cough.  Contact 14/04/2021 if you have persistent fevers, worsening cough, shortness of breath, pain with breathing, or other new/concerning symptoms.    Day 0--first day of symptoms, 12/11. Isolation days 1-5 (12/12-16) Days 6-10 (  12/17-21) masking, if fevers resolved, and your respiratory symptoms are improving.  If not improving or still having fever, you should continue isolation.   Follow Up Instructions:    I discussed the assessment and treatment plan with the patient. The patient was provided an opportunity to  ask questions and all were answered. The patient agreed with the plan and demonstrated an understanding of the instructions.   The patient was advised to call back or seek an in-person evaluation if the symptoms worsen or if the condition fails to improve as anticipated.  I spent 24 minutes dedicated to the care of this patient, including pre-visit review of records, face to face time, post-visit ordering of testing and documentation.    [name], MD

## 2021-12-18 ENCOUNTER — Encounter: Payer: Self-pay | Admitting: Family Medicine

## 2021-12-18 MED ORDER — BENZONATATE 200 MG PO CAPS: 200.0000 mg | ORAL_CAPSULE | Freq: Three times a day (TID) | ORAL | 0 refills | Status: DC | PRN

## 2021-12-18 MED ORDER — AZITHROMYCIN 250 MG PO TABS: ORAL_TABLET | ORAL | 0 refills | Status: DC

## 2021-12-30 DIAGNOSIS — H748X3 Other specified disorders of middle ear and mastoid, bilateral: Secondary | ICD-10-CM | POA: Diagnosis not present

## 2021-12-30 DIAGNOSIS — U099 Post covid-19 condition, unspecified: Secondary | ICD-10-CM | POA: Diagnosis not present

## 2021-12-30 DIAGNOSIS — J0100 Acute maxillary sinusitis, unspecified: Secondary | ICD-10-CM | POA: Diagnosis not present

## 2022-05-23 IMAGING — US US MFM OB FOLLOW-UP
1 series · 13 of 28 positions shown · non-contrast
Comparison: none

[Series 1: us mfm ob follow-up · 13 of 121 slices shown]
[im 5/121]
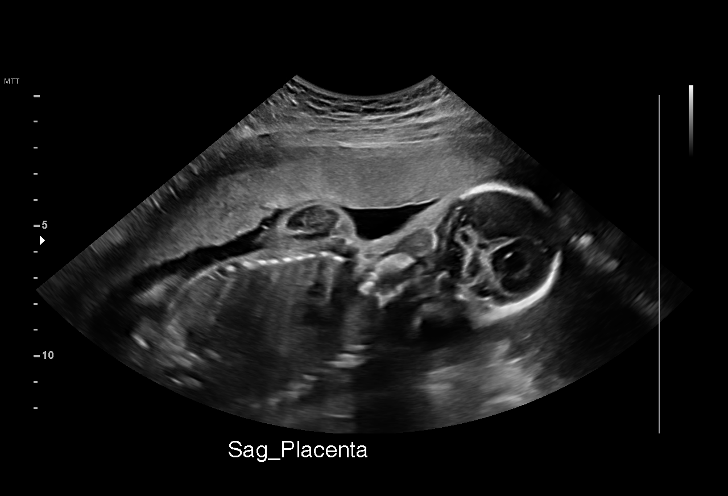
[im 14/121]
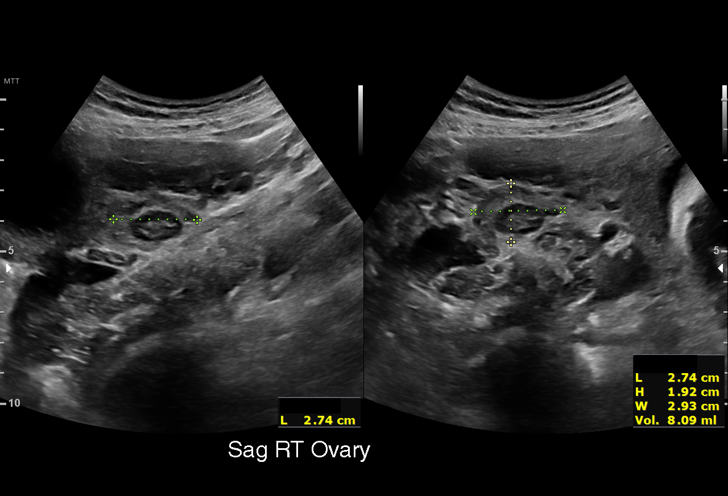
[im 23/121]
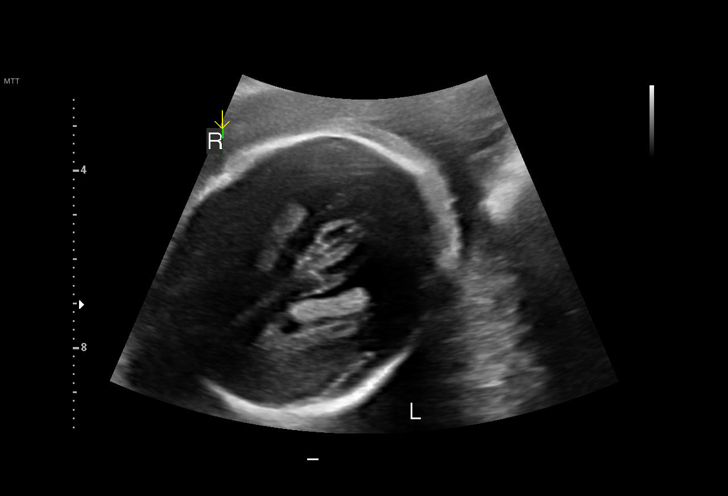
[im 32/121]
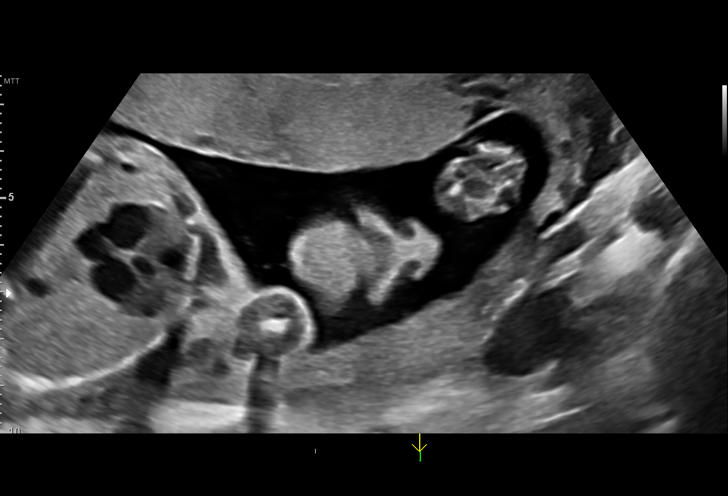
[im 41/121]
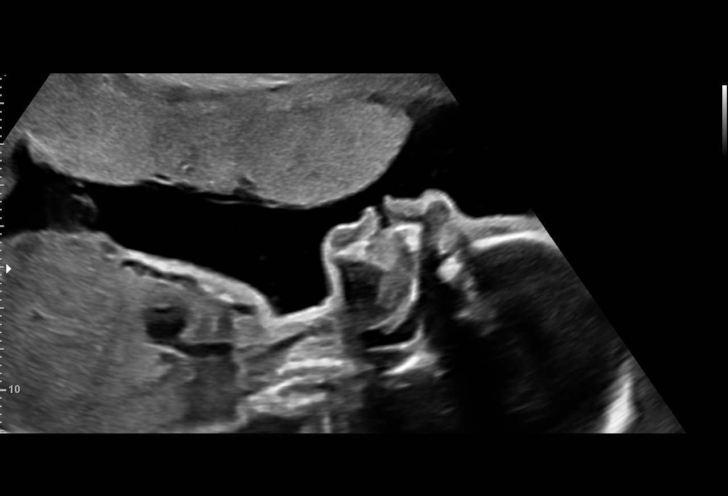
[im 49/121]
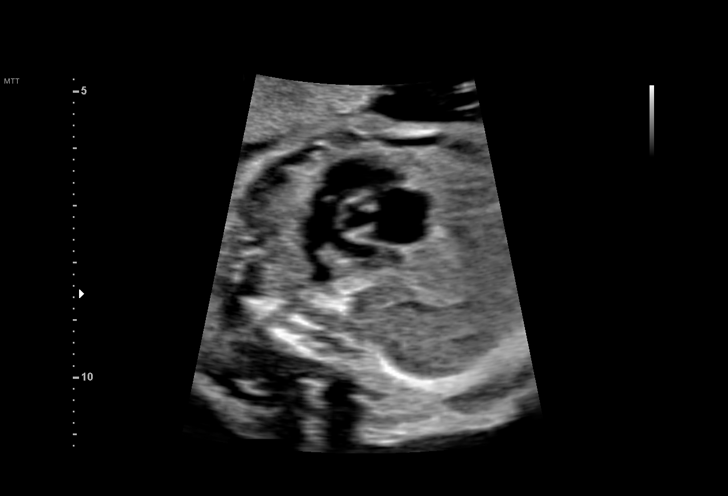
[im 63/121]
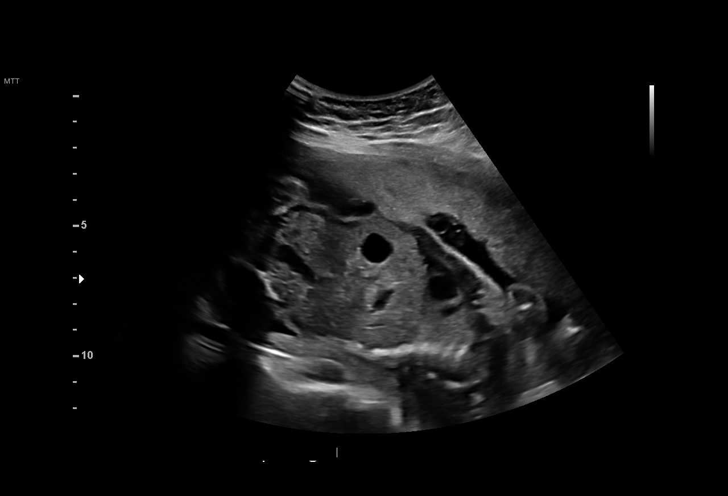
[im 72/121]
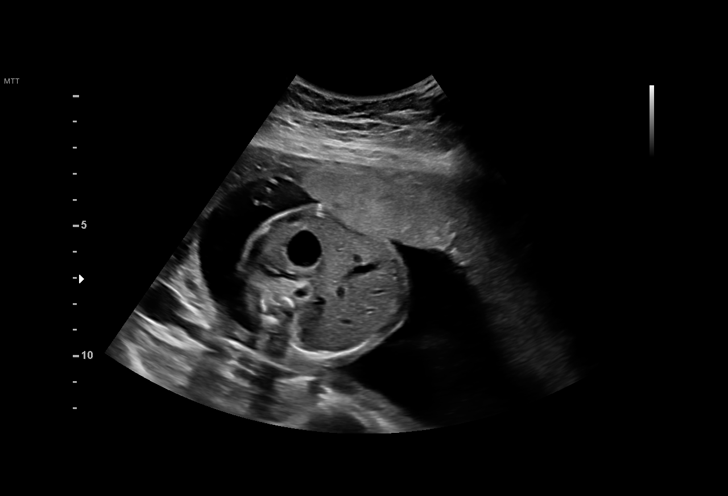
[im 81/121]
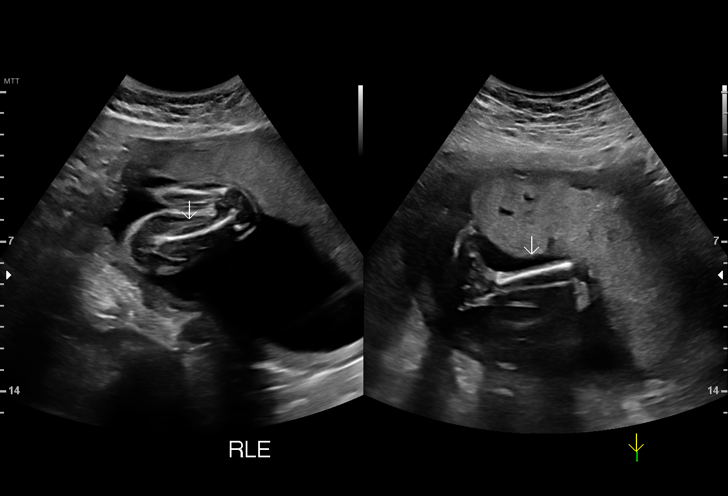
[im 89/121]
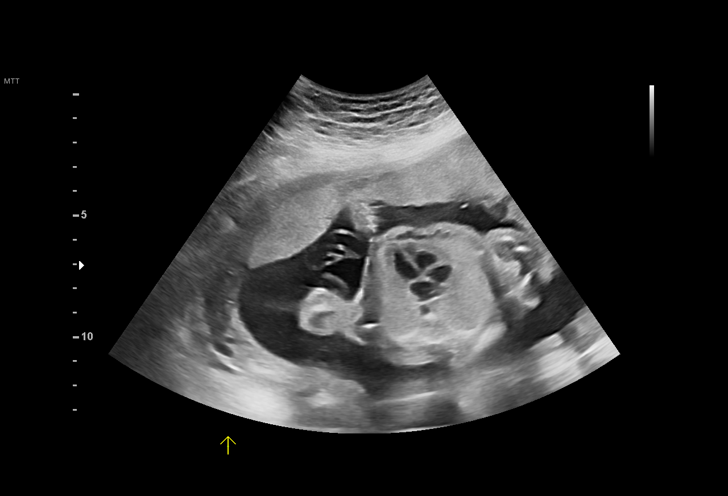
[im 98/121]
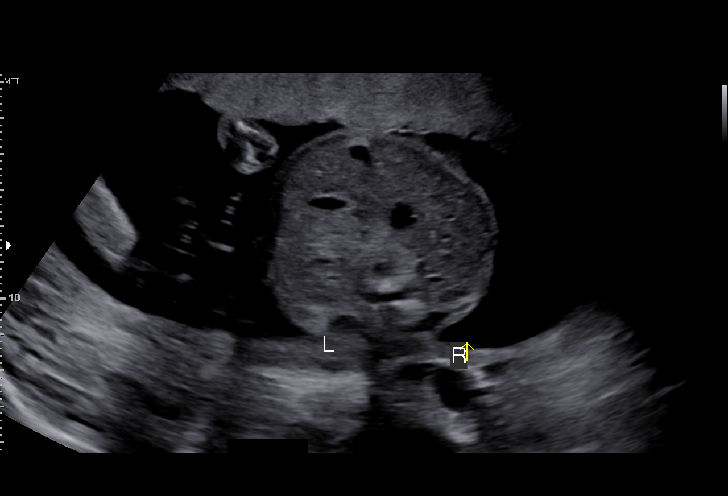
[im 107/121]
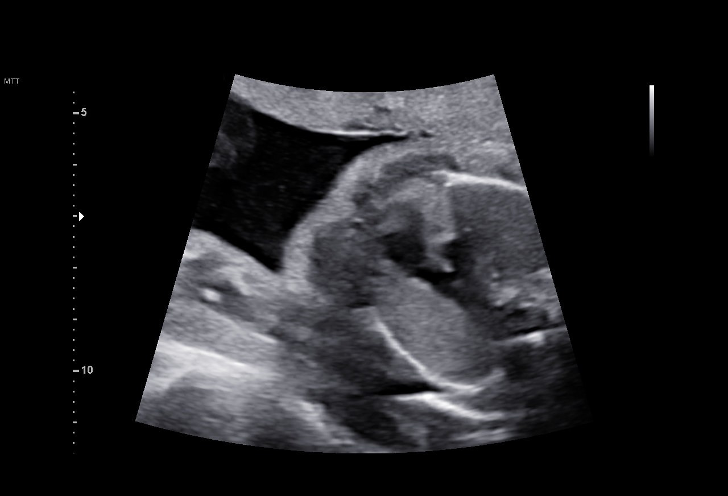
[im 116/121]
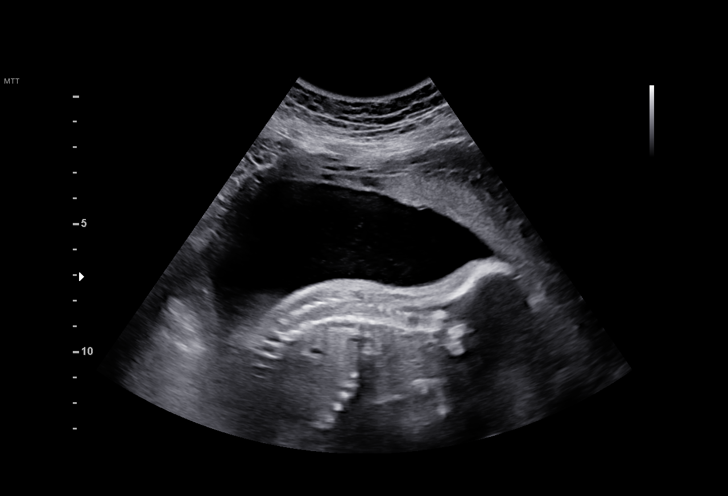

[13 of 28 positions shown; findings below may reference images not displayed]

Indications

 Rh negative state in antepartum
 23 weeks gestation of pregnancy
 LR NIPS/ Negative AFP
 Encounter for other antenatal screening
 follow-up
Fetal Evaluation

 Num Of Fetuses:         1
 Fetal Heart Rate(bpm):  147
 Cardiac Activity:       Observed
 Presentation:           Cephalic
 Placenta:               Anterior
 P. Cord Insertion:      Visualized, central

 Amniotic Fluid
 AFI FV:      Within normal limits

                             Largest Pocket(cm)

Biometry

 BPD:      58.8  mm     G. Age:  24w 0d         73  %    CI:        74.34   %    70 - 86
                                                         FL/HC:      18.7   %    19.2 -
 HC:      216.5  mm     G. Age:  23w 5d         51  %    HC/AC:      1.08        1.05 -
 AC:      199.9  mm     G. Age:  24w 4d         82  %    FL/BPD:     68.7   %    71 - 87
 FL:       40.4  mm     G. Age:  23w 0d         31  %    FL/AC:      20.2   %    20 - 24
 CER:        28  mm     G. Age:  24w 6d     > 97.7  %
 LV:        8.4  mm
 CM:        5.4  mm

 Est. FW:     644  gm      1 lb 7 oz     74  %
OB History

 Blood Type:   O-
 Gravidity:    2         Term:   1
Gestational Age

 LMP:           23w 2d        Date:  03/15/20                 EDD:   12/20/20
 U/S Today:     23w 6d                                        EDD:   12/16/20
 Best:          23w 2d     Det. By:  LMP  (03/15/20)          EDD:   12/20/20
Anatomy

 Cranium:               Appears normal         LVOT:                   Appears normal
 Cavum:                 Appears normal         Aortic Arch:            Appears normal
 Ventricles:            Appears normal         Ductal Arch:            Appears normal
 Choroid Plexus:        Appears normal         Diaphragm:              Appears normal
 Cerebellum:            Appears normal         Stomach:                Appears normal, left
                                                                       sided
 Posterior Fossa:       Appears normal         Abdomen:                Appears normal
 Nuchal Fold:           Previously seen        Abdominal Wall:         Appears nml (cord
                                                                       insert, abd wall)
 Face:                  Appears normal         Cord Vessels:           Appears normal (3
                        (orbits and profile)                           vessel cord)
 Lips:                  Appears normal         Kidneys:                Appear normal
 Palate:                Limited Views          Bladder:                Appears normal
 Thoracic:              Appears normal         Spine:                  Previously seen
 Heart:                 Appears normal         Upper Extremities:      Visualized
                        (4CH, axis, and                                previously
                        situs)
 RVOT:                  Appears normal         Lower Extremities:      Appears normal

 Other:  Fetus appears to be a male. Nasal Bone, Lenses, Heels, VC, 3VV
         and 3VTV visualized. Technically difficult due to fetal position.
Cervix Uterus Adnexa

 Cervix
 Length:           4.18  cm.
 Normal appearance by transabdominal scan.

 Uterus
 No abnormality visualized.

 Right Ovary
 Within normal limits.

 Left Ovary
 Within normal limits.

 Cul De Sac
 No free fluid seen.
 Adnexa
 No abnormality visualized.
Comments

 This patient was seen for a follow up exam as the views of
 the fetal anatomy were unable to be fully visualized during
 her last exam.  She denies any problems since her last exam.
 She was informed that the fetal growth and amniotic fluid
 level appears appropriate for her gestational age.
 The views of the fetal anatomy were visualized today.  There
 were no obvious anomalies noted.
 The limitations of ultrasound in the detection of all anomalies
 was discussed.
 Follow-up as indicated.

## 2022-09-27 ENCOUNTER — Ambulatory Visit: Payer: 59 | Admitting: Family Medicine

## 2022-11-13 ENCOUNTER — Encounter: Payer: Self-pay | Admitting: Nurse Practitioner

## 2022-11-13 ENCOUNTER — Ambulatory Visit: Payer: 59 | Admitting: Nurse Practitioner

## 2022-11-13 VITALS — BP 118/72 | HR 64 | Ht 68.75 in | Wt 143.2 lb

## 2022-11-13 DIAGNOSIS — E559 Vitamin D deficiency, unspecified: Secondary | ICD-10-CM | POA: Diagnosis not present

## 2022-11-13 DIAGNOSIS — Z13228 Encounter for screening for other metabolic disorders: Secondary | ICD-10-CM | POA: Diagnosis not present

## 2022-11-13 DIAGNOSIS — Z0000 Encounter for general adult medical examination without abnormal findings: Secondary | ICD-10-CM

## 2022-11-13 DIAGNOSIS — Z1321 Encounter for screening for nutritional disorder: Secondary | ICD-10-CM

## 2022-11-13 DIAGNOSIS — Z23 Encounter for immunization: Secondary | ICD-10-CM | POA: Diagnosis not present

## 2022-11-13 DIAGNOSIS — M79671 Pain in right foot: Secondary | ICD-10-CM

## 2022-11-13 DIAGNOSIS — Z1329 Encounter for screening for other suspected endocrine disorder: Secondary | ICD-10-CM | POA: Diagnosis not present

## 2022-11-13 DIAGNOSIS — Z130 Encounter for screening for diseases of the blood and blood-forming organs and certain disorders involving the immune mechanism: Secondary | ICD-10-CM

## 2022-11-13 NOTE — Progress Notes (Signed)
Shawna Clamp, DNP, AGNP-c [facility] [address] [place], [place] [address] Main Office [phone]  BP 118/72   Pulse 64   Ht 5' 8.75" (1.746 m)   Wt 143 lb 3.2 oz (65 kg)   LMP 10/29/2022 (Approximate)   BMI 21.30 kg/m    Subjective:    Patient ID: Jody Cunningham, female    DOB: [birth year], 37 y.o.   MRN: [id number]  HPI: Jody Cunningham is a 37 y.o. female presenting on 11/13/2022 for comprehensive medical examination.   History of Present Illness The patient, Jody Cunningham, presents for a routine check-up after a significant period of not seeing a doctor, with the last visit being around the birth of her son, who is now almost [age] years old. The patient's primary concern is a persistent discomfort in her foot, which she attributes to fallen arches and inadequate footwear during the summer months. The discomfort is described as a throbbing sensation, predominantly on the outside of the foot, which is exacerbated by prolonged walking or certain activities that involve repetitive bending of the foot. The pain is not constant and seems to have improved with the recent switch to gym shoes due to colder weather.  The patient also mentions a history of seeing a chiropractor for issues related to her hips, but it is unclear if this is related to the current foot discomfort. There is no reported bruising, swelling, or specific point tenderness in the foot. The patient denies any other health issues, changes in bowel or bladder habits, or alterations in menstrual cycle.  The patient's medical history appears to be unremarkable, with no regular medications and only two pregnancies resulting in normal deliveries. The patient's lifestyle and health behaviors are not extensively discussed in the consultation. The patient's family history is not mentioned, and there is no reported history of chronic diseases.  In summary, the patient presents with a primary complaint of  intermittent foot discomfort, likely related to fallen arches and inadequate footwear. The patient is otherwise healthy with no significant medical history.  Pertinent items are noted in HPI.  IMMUNIZATIONS:   Flu Vaccine: Flu vaccine given today Prevnar 13: Prevnar 13 N/A for this patient Prevnar 20: Prevnar 20 N/A for this patient Pneumovax 23: Pneumovax 23 N/A for this patient Vac Shingrix: Shingrix N/A for this patient HPV: N/A or Aged Out Tetanus: Tetanus completed in the last 10 years COVID: Declined today. Information on where to obtain the vaccine was provided.  RSV: No  HEALTH MAINTENANCE: Pap Smear HM Status: is up to date Mammogram HM Status: N/A Colon Cancer Screening HM Status: N/A Bone Density HM Status: N/A STI Testing HM Status: was declined  Lung CT HM Status: N/A  Concerns with vision, hearing, or dentition: No  Most Recent Depression Screen:     11/13/2022    3:23 PM 09/29/2020    9:36 AM 05/25/2020    8:46 AM 03/26/2019   11:14 AM  Depression screen PHQ 2/9  Decreased Interest 0 0 0 0  Down, Depressed, Hopeless 0 0 0 0  PHQ - 2 Score 0 0 0 0  Altered sleeping  0 0   Tired, decreased energy  1 1   Change in appetite  0 1   Feeling bad or failure about yourself   0 0   Trouble concentrating  0 0   Moving slowly or fidgety/restless  0 0   Suicidal thoughts  0 0   PHQ-9 Score  1 2    Most  Recent Anxiety Screen:     09/29/2020    9:36 AM 05/25/2020    8:47 AM  GAD 7 : Generalized Anxiety Score  Nervous, Anxious, on Edge 0 0  Control/stop worrying 0 0  Worry too much - different things 0 0  Trouble relaxing 0 0  Restless 0 0  Easily annoyed or irritable 0 0  Afraid - awful might happen 0 0  Total GAD 7 Score 0 0   Most Recent Fall Screen:    11/13/2022    3:23 PM 03/26/2019   11:14 AM 12/04/2017    3:02 PM  Fall Risk   Falls in the past year? 0 0 0  Number falls in past yr: 0 0   Injury with Fall? 0    Risk for fall due to : No Fall Risks     Follow up Falls evaluation completed      Past medical history, surgical history, medications, allergies, family history and social history reviewed with patient today and changes made to appropriate areas of the chart.  Past Medical History:  Past Medical History:  Diagnosis Date   History of COVID-19 03/26/2019   Medications:  No current outpatient medications on file prior to visit.   No current facility-administered medications on file prior to visit.   Surgical History:  Past Surgical History:  Procedure Laterality Date   TONSILLECTOMY     WISDOM TOOTH EXTRACTION  2003   Allergies:  Allergies  Allergen Reactions   Ceclor [Cefaclor] Rash   Family History:  Family History  Problem Relation Age of Onset   Cancer Maternal Grandmother    Thyroid disease Mother    Hypertension Mother    GER disease Father    Parkinson's disease Paternal Grandfather        Objective:    BP 118/72   Pulse 64   Ht 5' 8.75" (1.746 m)   Wt 143 lb 3.2 oz (65 kg)   LMP 10/29/2022 (Approximate)   BMI 21.30 kg/m   Wt Readings from Last 3 Encounters:  11/13/22 143 lb 3.2 oz (65 kg)  12/13/21 140 lb (63.5 kg)  12/20/20 173 lb 3.2 oz (78.6 kg)    Physical Exam Vitals and nursing note reviewed.  Constitutional:      General: She is not in acute distress.    Appearance: Normal appearance.  HENT:     Head: Normocephalic and atraumatic.     Right Ear: Hearing, tympanic membrane, ear canal and external ear normal.     Left Ear: Hearing, tympanic membrane, ear canal and external ear normal.     Nose: Nose normal.     Right Sinus: No maxillary sinus tenderness or frontal sinus tenderness.     Left Sinus: No maxillary sinus tenderness or frontal sinus tenderness.     Mouth/Throat:     Lips: Pink.     Mouth: Mucous membranes are moist.     Pharynx: Oropharynx is clear.  Eyes:     General: Lids are normal. Vision grossly intact.     Extraocular Movements: Extraocular movements intact.      Conjunctiva/sclera: Conjunctivae normal.     Pupils: Pupils are equal, round, and reactive to light.     Funduscopic exam:    Right eye: Red reflex present.        Left eye: Red reflex present.    Visual Fields: Right eye visual fields normal and left eye visual fields normal.  Neck:     Thyroid:  No thyromegaly.     Vascular: No carotid bruit.  Cardiovascular:     Rate and Rhythm: Normal rate and regular rhythm.     Chest Wall: PMI is not displaced.     Pulses: Normal pulses.          Dorsalis pedis pulses are 2+ on the right side and 2+ on the left side.       Posterior tibial pulses are 2+ on the right side and 2+ on the left side.     Heart sounds: Normal heart sounds. No murmur heard. Pulmonary:     Effort: Pulmonary effort is normal. No respiratory distress.     Breath sounds: Normal breath sounds.  Abdominal:     General: Abdomen is flat. Bowel sounds are normal. There is no distension.     Palpations: Abdomen is soft. There is no hepatomegaly, splenomegaly or mass.     Tenderness: There is no abdominal tenderness. There is no right CVA tenderness, left CVA tenderness, guarding or rebound.  Musculoskeletal:        General: Tenderness present. Normal range of motion.     Cervical back: Full passive range of motion without pain, normal range of motion and neck supple. No tenderness.     Right lower leg: No edema.     Left lower leg: No edema.     Comments: Right lateral foot pain.   Feet:     Left foot:     Toenail Condition: Left toenails are normal.  Lymphadenopathy:     Cervical: No cervical adenopathy.     Upper Body:     Right upper body: No supraclavicular adenopathy.     Left upper body: No supraclavicular adenopathy.  Skin:    General: Skin is warm and dry.     Capillary Refill: Capillary refill takes less than 2 seconds.     Nails: There is no clubbing.  Neurological:     General: No focal deficit present.     Mental Status: She is alert and oriented to  person, place, and time.     GCS: GCS eye subscore is 4. GCS verbal subscore is 5. GCS motor subscore is 6.     Sensory: Sensation is intact.     Motor: Motor function is intact.     Coordination: Coordination is intact.     Gait: Gait is intact.     Deep Tendon Reflexes: Reflexes are normal and symmetric.  Psychiatric:        Attention and Perception: Attention normal.        Mood and Affect: Mood normal.        Speech: Speech normal.        Behavior: Behavior normal. Behavior is cooperative.        Thought Content: Thought content normal.        Cognition and Memory: Cognition and memory normal.        Judgment: Judgment normal.      Results for orders placed or performed in visit on 11/13/22  CBC with Differential/Platelet  Result Value Ref Range   WBC 9.0 3.4 - 10.8 x10E3/uL   RBC 4.61 3.77 - 5.28 x10E6/uL   Hemoglobin 14.4 11.1 - 15.9 g/dL   Hematocrit 16.1 09.6 - 46.6 %   MCV 92 79 - 97 fL   MCH 31.2 26.6 - 33.0 pg   MCHC 34.1 31.5 - 35.7 g/dL   RDW 04.5 40.9 - 81.1 %   Platelets 312 150 -  450 x10E3/uL   Neutrophils 64 Not Estab. %   Lymphs 30 Not Estab. %   Monocytes 5 Not Estab. %   Eos 0 Not Estab. %   Basos 1 Not Estab. %   Neutrophils Absolute 5.7 1.4 - 7.0 x10E3/uL   Lymphocytes Absolute 2.7 0.7 - 3.1 x10E3/uL   Monocytes Absolute 0.5 0.1 - 0.9 x10E3/uL   EOS (ABSOLUTE) 0.0 0.0 - 0.4 x10E3/uL   Basophils Absolute 0.1 0.0 - 0.2 x10E3/uL   Immature Granulocytes 0 Not Estab. %   Immature Grans (Abs) 0.0 0.0 - 0.1 x10E3/uL  CMP14+EGFR  Result Value Ref Range   Glucose 84 70 - 99 mg/dL   BUN 8 6 - 20 mg/dL   Creatinine, Ser 1.61 0.57 - 1.00 mg/dL   eGFR 096 >04 VW/UJW/1.19   BUN/Creatinine Ratio 11 9 - 23   Sodium 139 134 - 144 mmol/L   Potassium 4.0 3.5 - 5.2 mmol/L   Chloride 102 96 - 106 mmol/L   CO2 22 20 - 29 mmol/L   Calcium 9.2 8.7 - 10.2 mg/dL   Total Protein 7.1 6.0 - 8.5 g/dL   Albumin 4.7 3.9 - 4.9 g/dL   Globulin, Total 2.4 1.5 - 4.5 g/dL    Bilirubin Total 0.4 0.0 - 1.2 mg/dL   Alkaline Phosphatase 77 44 - 121 IU/L   AST 16 0 - 40 IU/L   ALT 30 0 - 32 IU/L  Hemoglobin A1c  Result Value Ref Range   Hgb A1c MFr Bld 5.2 4.8 - 5.6 %   Est. average glucose Bld gHb Est-mCnc 103 mg/dL  Lipid panel  Result Value Ref Range   Cholesterol, Total 184 100 - 199 mg/dL   Triglycerides 72 0 - 149 mg/dL   HDL 61 >14 mg/dL   VLDL Cholesterol Cal 13 5 - 40 mg/dL   LDL Chol Calc (NIH) 782 (H) 0 - 99 mg/dL   Chol/HDL Ratio 3.0 0.0 - 4.4 ratio  TSH  Result Value Ref Range   TSH 1.050 0.450 - 4.500 uIU/mL  VITAMIN D 25 Hydroxy (Vit-D Deficiency, Fractures)  Result Value Ref Range   Vit D, 25-Hydroxy 16.2 (L) 30.0 - 100.0 ng/mL       Assessment & Plan:   Problem List Items Addressed This Visit     Encounter for annual physical exam - Primary    CPE completed today. Review of HM activities and recommendations discussed and provided on AVS. Anticipatory guidance, diet, and exercise recommendations provided. Medications, allergies, and hx reviewed and updated as necessary. Orders placed as listed below.  Plan: - Labs ordered. Will make changes as necessary based on results.  - I will review these results and send recommendations via MyChart or a telephone call.  - F/U with CPE in 1 year or sooner for acute/chronic health needs as directed.        Relevant Orders   CBC with Differential/Platelet (Completed)   CMP14+EGFR (Completed)   Hemoglobin A1c (Completed)   Lipid panel (Completed)   TSH (Completed)   VITAMIN D 25 Hydroxy (Vit-D Deficiency, Fractures) (Completed)   Right foot pain    Intermittent lateral foot pain likely due to fallen arches and inadequate support from summer footwear. No bruising or swelling. Pain improves with supportive footwear. Stress fracture unlikely but imaging may be needed if symptoms persist. - Recommend supportive shoes with arch support (e.g., gym shoes, Birkenstocks). - Consider x-ray if pain  persists or worsens. - Follow up if symptoms do  not improve or worsen.      Other Visit Diagnoses     Need for influenza vaccination       Relevant Orders   Flu vaccine trivalent PF, 6mos and older(Flulaval,Afluria,Fluarix,Fluzone) (Completed)   Screening for endocrine, nutritional, metabolic and immunity disorder       Relevant Orders   CBC with Differential/Platelet (Completed)   CMP14+EGFR (Completed)   Hemoglobin A1c (Completed)   Lipid panel (Completed)   TSH (Completed)   VITAMIN D 25 Hydroxy (Vit-D Deficiency, Fractures) (Completed)         Follow up plan: Return in about 1 year (around 11/13/2023) for CPE.  NEXT PREVENTATIVE PHYSICAL DUE IN 1 YEAR.  PATIENT COUNSELING PROVIDED FOR ALL ADULT PATIENTS: A well balanced diet low in saturated fats, cholesterol, and moderation in carbohydrates.  This can be as simple as monitoring portion sizes and cutting back on sugary beverages such as soda and juice to start with.    Daily water consumption of at least 64 ounces.  Physical activity at least 180 minutes per week.  If just starting out, start 10 minutes a day and work your way up.   This can be as simple as taking the stairs instead of the elevator and walking 2-3 laps around the office  purposefully every day.   STD protection, partner selection, and regular testing if high risk.  Limited consumption of alcoholic beverages if alcohol is consumed. For men, I recommend no more than 14 alcoholic beverages per week, spread out throughout the week (max 2 per day). Avoid "binge" drinking or consuming large quantities of alcohol in one setting.  Please let me know if you feel you may need help with reduction or quitting alcohol consumption.   Avoidance of nicotine, if used. Please let me know if you feel you may need help with reduction or quitting nicotine use.   Daily mental health attention. This can be in the form of 5 minute daily meditation, prayer, journaling,  yoga, reflection, etc.  Purposeful attention to your emotions and mental state can significantly improve your overall wellbeing  and  Health.  Please know that I am here to help you with all of your health care goals and am happy to work with you to find a solution that works best for you.  The greatest advice I have received with any changes in life are to take it one step at a time, that even means if all you can focus on is the next 60 seconds, then do that and celebrate your victories.  With any changes in life, you will have set backs, and that is OK. The important thing to remember is, if you have a set back, it is not a failure, it is an opportunity to try again! Screening Testing Mammogram Every 1 -2 years based on history and risk factors Starting at age 47 Pap Smear Ages 21-39 every 3 years Ages 81-65 every 5 years with HPV testing More frequent testing may be required based on results and history Colon Cancer Screening Every 1-10 years based on test performed, risk factors, and history Starting at age 51 Bone Density Screening Every 2-10 years based on history Starting at age 80 for women Recommendations for men differ based on medication usage, history, and risk factors AAA Screening One time ultrasound Men 34-36 years old who have every smoked Lung Cancer Screening Low Dose Lung CT every 12 months Age 78-80 years with a 30 pack-year smoking history who still smoke  or who have quit within the last 15 years   Screening Labs Routine  Labs: Complete Blood Count (CBC), Complete Metabolic Panel (CMP), Cholesterol (Lipid Panel) Every 6-12 months based on history and medications May be recommended more frequently based on current conditions or previous results Hemoglobin A1c Lab Every 3-12 months based on history and previous results Starting at age 17 or earlier with diagnosis of diabetes, high cholesterol, BMI >26, and/or risk factors Frequent monitoring for patients with  diabetes to ensure blood sugar control Thyroid Panel (TSH) Every 6 months based on history, symptoms, and risk factors May be repeated more often if on medication HIV One time testing for all patients 78 and older May be repeated more frequently for patients with increased risk factors or exposure Hepatitis C One time testing for all patients 29 and older May be repeated more frequently for patients with increased risk factors or exposure Gonorrhea, Chlamydia Every 12 months for all sexually active persons 13-24 years Additional monitoring may be recommended for those who are considered high risk or who have symptoms Every 12 months for any woman on birth control, regardless of sexual activity PSA Men 40-73 years old with risk factors Additional screening may be recommended from age 53-69 based on risk factors, symptoms, and history  Vaccine Recommendations Tetanus Booster All adults every 10 years Flu Vaccine All patients 6 months and older every year COVID Vaccine All patients 12 years and older Initial dosing with booster May recommend additional booster based on age and health history HPV Vaccine 2 doses all patients age 69-26 Dosing may be considered for patients over 26 Shingles Vaccine (Shingrix) 2 doses all adults 55 years and older Pneumonia (Pneumovax 23) All adults 65 years and older May recommend earlier dosing based on health history One year apart from Prevnar 13 Pneumonia (Prevnar 87) All adults 65 years and older Dosed 1 year after Pneumovax 23 Pneumonia (Prevnar 20) One time alternative to the two dosing of 13 and 23 For all adults with initial dose of 23, 20 is recommended 1 year later For all adults with initial dose of 13, 23 is still recommended as second option 1 year later

## 2022-11-13 NOTE — Patient Instructions (Signed)
 For all adult patients, I recommend A well balanced diet low in saturated fats, cholesterol, and moderation in carbohydrates.   This can be as simple as monitoring portion sizes and cutting back on sugary beverages such as soda and juice to start with.    Daily water consumption of at least 64 ounces.  Physical activity at least 180 minutes per week, if just starting out.   This can be as simple as taking the stairs instead of the elevator and walking 2-3 laps around the office  purposefully every day.   STD protection, partner selection, and regular testing if high risk.  Limited consumption of alcoholic beverages if alcohol is consumed.  For women, I recommend no more than 7 alcoholic beverages per week, spread out throughout the week.  Avoid "binge" drinking or consuming large quantities of alcohol in one setting.   Please let me know if you feel you may need help with reduction or quitting alcohol consumption.   Avoidance of nicotine, if used.  Please let me know if you feel you may need help with reduction or quitting nicotine use.   Daily mental health attention.  This can be in the form of 5 minute daily meditation, prayer, journaling, yoga, reflection, etc.   Purposeful attention to your emotions and mental state can significantly improve your overall wellbeing  and  Health.  Please know that I am here to help you with all of your health care goals and am happy to work with you to find a solution that works best for you.  The greatest advice I have received with any changes in life are to take it one step at a time, that even means if all you can focus on is the next 60 seconds, then do that and celebrate your victories.  With any changes in life, you will have set backs, and that is OK. The important thing to remember is, if you have a set back, it is not a failure, it is an opportunity to try again!  Health Maintenance Recommendations Screening Testing Mammogram Every 1 -2  years based on history and risk factors Starting at age 40 Pap Smear Ages 21-39 every 3 years Ages 30-65 every 5 years with HPV testing More frequent testing may be required based on results and history Colon Cancer Screening Every 1-10 years based on test performed, risk factors, and history Starting at age 45 Bone Density Screening Every 2-10 years based on history Starting at age 65 for women Recommendations for men differ based on medication usage, history, and risk factors AAA Screening One time ultrasound Men 65-75 years old who have every smoked Lung Cancer Screening Low Dose Lung CT every 12 months Age 55-80 years with a 30 pack-year smoking history who still smoke or who have quit within the last 15 years  Screening Labs Routine  Labs: Complete Blood Count (CBC), Complete Metabolic Panel (CMP), Cholesterol (Lipid Panel) Every 6-12 months based on history and medications May be recommended more frequently based on current conditions or previous results Hemoglobin A1c Lab Every 3-12 months based on history and previous results Starting at age 45 or earlier with diagnosis of diabetes, high cholesterol, BMI >26, and/or risk factors Frequent monitoring for patients with diabetes to ensure blood sugar control Thyroid Panel (TSH w/ T3 & T4) Every 6 months based on history, symptoms, and risk factors May be repeated more often if on medication HIV One time testing for all patients 13 and older May be   repeated more frequently for patients with increased risk factors or exposure Hepatitis C One time testing for all patients 18 and older May be repeated more frequently for patients with increased risk factors or exposure Gonorrhea, Chlamydia Every 12 months for all sexually active persons 13-24 years Additional monitoring may be recommended for those who are considered high risk or who have symptoms PSA Men 40-54 years old with risk factors Additional screening may be  recommended from age 55-69 based on risk factors, symptoms, and history  Vaccine Recommendations Tetanus Booster All adults every 10 years Flu Vaccine All patients 6 months and older every year COVID Vaccine All patients 12 years and older Initial dosing with booster May recommend additional booster based on age and health history HPV Vaccine 2 doses all patients age 9-26 Dosing may be considered for patients over 26 Shingles Vaccine (Shingrix) 2 doses all adults 55 years and older Pneumonia (Pneumovax 23) All adults 65 years and older May recommend earlier dosing based on health history Pneumonia (Prevnar 13) All adults 65 years and older Dosed 1 year after Pneumovax 23  Additional Screening, Testing, and Vaccinations may be recommended on an individualized basis based on family history, health history, risk factors, and/or exposure.   

## 2022-11-14 LAB — CBC WITH DIFFERENTIAL/PLATELET
Basophils Absolute: 0.1 10*3/uL (ref 0.0–0.2)
Basos: 1 %
EOS (ABSOLUTE): 0 10*3/uL (ref 0.0–0.4)
Eos: 0 %
Hematocrit: 42.2 % (ref 34.0–46.6)
Hemoglobin: 14.4 g/dL (ref 11.1–15.9)
Immature Grans (Abs): 0 10*3/uL (ref 0.0–0.1)
Immature Granulocytes: 0 %
Lymphocytes Absolute: 2.7 10*3/uL (ref 0.7–3.1)
Lymphs: 30 %
MCH: 31.2 pg (ref 26.6–33.0)
MCHC: 34.1 g/dL (ref 31.5–35.7)
MCV: 92 fL (ref 79–97)
Monocytes Absolute: 0.5 10*3/uL (ref 0.1–0.9)
Monocytes: 5 %
Neutrophils Absolute: 5.7 10*3/uL (ref 1.4–7.0)
Neutrophils: 64 %
Platelets: 312 10*3/uL (ref 150–450)
RBC: 4.61 x10E6/uL (ref 3.77–5.28)
RDW: 12.2 % (ref 11.7–15.4)
WBC: 9 10*3/uL (ref 3.4–10.8)

## 2022-11-14 LAB — CMP14+EGFR
ALT: 30 [IU]/L (ref 0–32)
AST: 16 [IU]/L (ref 0–40)
Albumin: 4.7 g/dL (ref 3.9–4.9)
Alkaline Phosphatase: 77 [IU]/L (ref 44–121)
BUN/Creatinine Ratio: 11 (ref 9–23)
BUN: 8 mg/dL (ref 6–20)
Bilirubin Total: 0.4 mg/dL (ref 0.0–1.2)
CO2: 22 mmol/L (ref 20–29)
Calcium: 9.2 mg/dL (ref 8.7–10.2)
Chloride: 102 mmol/L (ref 96–106)
Creatinine, Ser: 0.72 mg/dL (ref 0.57–1.00)
Globulin, Total: 2.4 g/dL (ref 1.5–4.5)
Glucose: 84 mg/dL (ref 70–99)
Potassium: 4 mmol/L (ref 3.5–5.2)
Sodium: 139 mmol/L (ref 134–144)
Total Protein: 7.1 g/dL (ref 6.0–8.5)
eGFR: 111 mL/min/{1.73_m2} (ref >59)

## 2022-11-14 LAB — LIPID PANEL
Chol/HDL Ratio: 3 ratio (ref 0.0–4.4)
Cholesterol, Total: 184 mg/dL (ref 100–199)
HDL: 61 mg/dL (ref >39)
LDL Chol Calc (NIH): 110 mg/dL — ABNORMAL HIGH (ref 0–99)
Triglycerides: 72 mg/dL (ref 0–149)
VLDL Cholesterol Cal: 13 mg/dL (ref 5–40)

## 2022-11-14 LAB — VITAMIN D 25 HYDROXY (VIT D DEFICIENCY, FRACTURES): Vit D, 25-Hydroxy: 16.2 ng/mL — ABNORMAL LOW (ref 30.0–100.0)

## 2022-11-14 LAB — TSH: TSH: 1.05 u[IU]/mL (ref 0.450–4.500)

## 2022-11-14 LAB — HEMOGLOBIN A1C
Est. average glucose Bld gHb Est-mCnc: 103 mg/dL
Hgb A1c MFr Bld: 5.2 % (ref 4.8–5.6)

## 2022-11-19 ENCOUNTER — Encounter: Payer: Self-pay | Admitting: Nurse Practitioner

## 2022-11-22 ENCOUNTER — Encounter: Payer: Self-pay | Admitting: Nurse Practitioner

## 2022-11-22 DIAGNOSIS — Z0000 Encounter for general adult medical examination without abnormal findings: Secondary | ICD-10-CM | POA: Insufficient documentation

## 2022-11-22 DIAGNOSIS — M79671 Pain in right foot: Secondary | ICD-10-CM | POA: Insufficient documentation

## 2022-11-22 MED ORDER — VITAMIN D (ERGOCALCIFEROL) 1.25 MG (50000 UNIT) PO CAPS: 50000.0000 [IU] | ORAL_CAPSULE | ORAL | 3 refills | Status: AC

## 2022-11-22 NOTE — Assessment & Plan Note (Signed)

## 2022-11-22 NOTE — Assessment & Plan Note (Signed)
Intermittent lateral foot pain likely due to fallen arches and inadequate support from summer footwear. No bruising or swelling. Pain improves with supportive footwear. Stress fracture unlikely but imaging may be needed if symptoms persist. - Recommend supportive shoes with arch support (e.g., gym shoes, Birkenstocks). - Consider x-ray if pain persists or worsens. - Follow up if symptoms do not improve or worsen.

## 2023-12-16 ENCOUNTER — Ambulatory Visit: Payer: Self-pay | Admitting: Family Medicine

## 2023-12-20 ENCOUNTER — Encounter: Payer: Self-pay | Admitting: Nurse Practitioner

## 2024-02-11 ENCOUNTER — Encounter: Payer: Self-pay | Admitting: Nurse Practitioner
